# Patient Record
Sex: Female | Born: 1940 | Race: White | Hispanic: No | Smoking: Former smoker
Health system: Southern US, Community
[De-identification: ages and names within clinical notes are randomized; demographics above are authoritative.]

## PROBLEM LIST (undated history)

## (undated) DIAGNOSIS — E559 Vitamin D deficiency, unspecified: Secondary | ICD-10-CM

## (undated) DIAGNOSIS — F32A Depression, unspecified: Secondary | ICD-10-CM

## (undated) DIAGNOSIS — G4752 REM sleep behavior disorder: Secondary | ICD-10-CM

## (undated) DIAGNOSIS — C801 Malignant (primary) neoplasm, unspecified: Secondary | ICD-10-CM

## (undated) DIAGNOSIS — K8020 Calculus of gallbladder without cholecystitis without obstruction: Secondary | ICD-10-CM

## (undated) DIAGNOSIS — S92912A Unspecified fracture of left toe(s), initial encounter for closed fracture: Secondary | ICD-10-CM

## (undated) DIAGNOSIS — G43909 Migraine, unspecified, not intractable, without status migrainosus: Secondary | ICD-10-CM

## (undated) DIAGNOSIS — F513 Sleepwalking [somnambulism]: Secondary | ICD-10-CM

## (undated) DIAGNOSIS — F419 Anxiety disorder, unspecified: Secondary | ICD-10-CM

## (undated) DIAGNOSIS — T7840XA Allergy, unspecified, initial encounter: Secondary | ICD-10-CM

## (undated) DIAGNOSIS — S42009A Fracture of unspecified part of unspecified clavicle, initial encounter for closed fracture: Secondary | ICD-10-CM

## (undated) DIAGNOSIS — N828 Other female genital tract fistulae: Secondary | ICD-10-CM

## (undated) DIAGNOSIS — J449 Chronic obstructive pulmonary disease, unspecified: Secondary | ICD-10-CM

## (undated) DIAGNOSIS — F329 Major depressive disorder, single episode, unspecified: Secondary | ICD-10-CM

## (undated) DIAGNOSIS — R569 Unspecified convulsions: Secondary | ICD-10-CM

## (undated) DIAGNOSIS — K5732 Diverticulitis of large intestine without perforation or abscess without bleeding: Secondary | ICD-10-CM

## (undated) HISTORY — PX: NOSE SURGERY: SHX723

## (undated) HISTORY — DX: Major depressive disorder, single episode, unspecified: F32.9

## (undated) HISTORY — DX: Anxiety disorder, unspecified: F41.9

## (undated) HISTORY — DX: Allergy, unspecified, initial encounter: T78.40XA

## (undated) HISTORY — DX: Calculus of gallbladder without cholecystitis without obstruction: K80.20

## (undated) HISTORY — DX: Vitamin D deficiency, unspecified: E55.9

## (undated) HISTORY — DX: Chronic obstructive pulmonary disease, unspecified: J44.9

## (undated) HISTORY — PX: CATARACT EXTRACTION: SUR2

## (undated) HISTORY — DX: Migraine, unspecified, not intractable, without status migrainosus: G43.909

## (undated) HISTORY — DX: Unspecified convulsions: R56.9

## (undated) HISTORY — DX: Malignant (primary) neoplasm, unspecified: C80.1

## (undated) HISTORY — PX: WISDOM TOOTH EXTRACTION: SHX21

## (undated) HISTORY — DX: Fracture of unspecified part of unspecified clavicle, initial encounter for closed fracture: S42.009A

## (undated) HISTORY — PX: ANKLE SURGERY: SHX546

## (undated) HISTORY — DX: REM sleep behavior disorder: G47.52

## (undated) HISTORY — DX: Depression, unspecified: F32.A

## (undated) HISTORY — PX: TUBAL LIGATION: SHX77

## (undated) HISTORY — DX: Unspecified fracture of left toe(s), initial encounter for closed fracture: S92.912A

## (undated) HISTORY — DX: Other female genital tract fistulae: N82.8

## (undated) HISTORY — PX: SKIN CANCER EXCISION: SHX779

---
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
## 1980-11-20 HISTORY — PX: ABDOMINAL HYSTERECTOMY: SHX81

## 1998-12-29 ENCOUNTER — Ambulatory Visit (HOSPITAL_COMMUNITY): Admission: RE | Admit: 1998-12-29 | Discharge: 1998-12-29 | Payer: Self-pay | Admitting: *Deleted

## 1998-12-29 ENCOUNTER — Encounter: Payer: Self-pay | Admitting: *Deleted

## 2000-01-27 ENCOUNTER — Encounter: Payer: Self-pay | Admitting: *Deleted

## 2000-01-27 ENCOUNTER — Ambulatory Visit (HOSPITAL_COMMUNITY): Admission: RE | Admit: 2000-01-27 | Discharge: 2000-01-27 | Payer: Self-pay | Admitting: *Deleted

## 2000-07-25 ENCOUNTER — Ambulatory Visit (HOSPITAL_BASED_OUTPATIENT_CLINIC_OR_DEPARTMENT_OTHER): Admission: RE | Admit: 2000-07-25 | Discharge: 2000-07-25 | Payer: Self-pay | Admitting: Plastic Surgery

## 2000-07-25 ENCOUNTER — Encounter (INDEPENDENT_AMBULATORY_CARE_PROVIDER_SITE_OTHER): Payer: Self-pay | Admitting: *Deleted

## 2001-03-13 ENCOUNTER — Encounter: Payer: Self-pay | Admitting: *Deleted

## 2001-03-13 ENCOUNTER — Ambulatory Visit (HOSPITAL_COMMUNITY): Admission: RE | Admit: 2001-03-13 | Discharge: 2001-03-13 | Payer: Self-pay | Admitting: *Deleted

## 2001-08-01 ENCOUNTER — Encounter: Payer: Self-pay | Admitting: Family Medicine

## 2001-08-01 ENCOUNTER — Encounter: Admission: RE | Admit: 2001-08-01 | Discharge: 2001-08-01 | Payer: Self-pay | Admitting: Family Medicine

## 2001-11-01 ENCOUNTER — Emergency Department (HOSPITAL_COMMUNITY): Admission: EM | Admit: 2001-11-01 | Discharge: 2001-11-01 | Payer: Self-pay | Admitting: Emergency Medicine

## 2002-05-01 ENCOUNTER — Ambulatory Visit (HOSPITAL_COMMUNITY): Admission: RE | Admit: 2002-05-01 | Discharge: 2002-05-01 | Payer: Self-pay | Admitting: *Deleted

## 2002-05-01 ENCOUNTER — Encounter: Payer: Self-pay | Admitting: *Deleted

## 2002-07-03 ENCOUNTER — Ambulatory Visit (HOSPITAL_BASED_OUTPATIENT_CLINIC_OR_DEPARTMENT_OTHER): Admission: RE | Admit: 2002-07-03 | Discharge: 2002-07-04 | Payer: Self-pay | Admitting: Orthopedic Surgery

## 2003-08-13 ENCOUNTER — Encounter: Payer: Self-pay | Admitting: *Deleted

## 2003-08-13 ENCOUNTER — Ambulatory Visit (HOSPITAL_COMMUNITY): Admission: RE | Admit: 2003-08-13 | Discharge: 2003-08-13 | Payer: Self-pay | Admitting: *Deleted

## 2003-08-18 ENCOUNTER — Encounter: Admission: RE | Admit: 2003-08-18 | Discharge: 2003-08-18 | Payer: Self-pay | Admitting: *Deleted

## 2003-08-18 ENCOUNTER — Encounter: Payer: Self-pay | Admitting: General Surgery

## 2005-04-07 ENCOUNTER — Encounter: Admission: RE | Admit: 2005-04-07 | Discharge: 2005-04-07 | Payer: Self-pay | Admitting: *Deleted

## 2005-08-03 ENCOUNTER — Ambulatory Visit (HOSPITAL_COMMUNITY): Admission: RE | Admit: 2005-08-03 | Discharge: 2005-08-03 | Payer: Self-pay | Admitting: General Surgery

## 2006-04-15 ENCOUNTER — Ambulatory Visit (HOSPITAL_COMMUNITY): Admission: RE | Admit: 2006-04-15 | Discharge: 2006-04-15 | Payer: Self-pay | Admitting: Family Medicine

## 2006-05-18 ENCOUNTER — Encounter: Admission: RE | Admit: 2006-05-18 | Discharge: 2006-05-18 | Payer: Self-pay | Admitting: Gastroenterology

## 2006-08-15 ENCOUNTER — Ambulatory Visit (HOSPITAL_COMMUNITY): Admission: RE | Admit: 2006-08-15 | Discharge: 2006-08-15 | Payer: Self-pay | Admitting: General Surgery

## 2007-08-23 ENCOUNTER — Ambulatory Visit (HOSPITAL_COMMUNITY): Admission: RE | Admit: 2007-08-23 | Discharge: 2007-08-23 | Payer: Self-pay | Admitting: Family Medicine

## 2008-04-25 ENCOUNTER — Ambulatory Visit (HOSPITAL_COMMUNITY): Admission: RE | Admit: 2008-04-25 | Discharge: 2008-04-25 | Payer: Self-pay | Admitting: Family Medicine

## 2008-06-11 ENCOUNTER — Emergency Department (HOSPITAL_COMMUNITY): Admission: EM | Admit: 2008-06-11 | Discharge: 2008-06-11 | Payer: Self-pay | Admitting: Emergency Medicine

## 2008-08-25 ENCOUNTER — Ambulatory Visit (HOSPITAL_COMMUNITY): Admission: RE | Admit: 2008-08-25 | Discharge: 2008-08-25 | Payer: Self-pay | Admitting: General Surgery

## 2008-09-09 ENCOUNTER — Encounter: Admission: RE | Admit: 2008-09-09 | Discharge: 2008-09-09 | Payer: Self-pay | Admitting: General Surgery

## 2009-02-12 ENCOUNTER — Emergency Department (HOSPITAL_COMMUNITY): Admission: EM | Admit: 2009-02-12 | Discharge: 2009-02-13 | Payer: Self-pay | Admitting: Emergency Medicine

## 2009-02-24 IMAGING — CT CT ABD-PELV W/O CM
3 of 8 series · 12 of 42 positions shown, 18 images · IV contrast (CONTRAST)
Comparison: NONE

CLINICAL DATA: Mass in the pelvis of the right kidney on 
ultrasound  of the abdomen 09-10-2008. Hx. of hematuria and 
diverticulitis.   No prior CT exam. 

CT ABDOMEN AND PELVIS WITHOUT AND WITH INTRAVENOUS CONTRAST
TECHNIQUE: Multiple axial images were obtained from the diaphragm 
through the pelvis.

[Series 4: venous · axial · portal-venous · 0.68mm/px · z∈[+397,+672]mm · 5 of 83 slices shown, 10 images]
[im 14/83  soft-tissue]
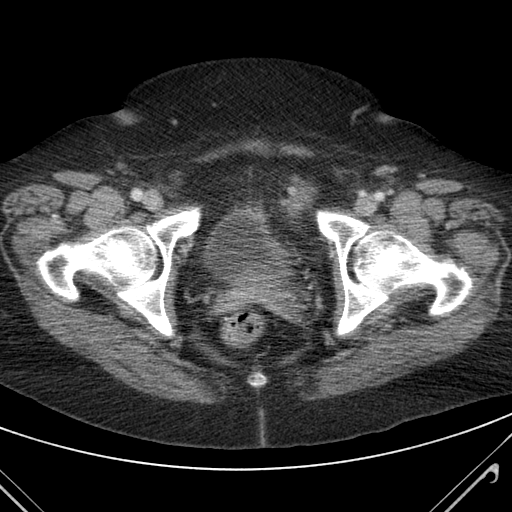
[im 14/83  bone]
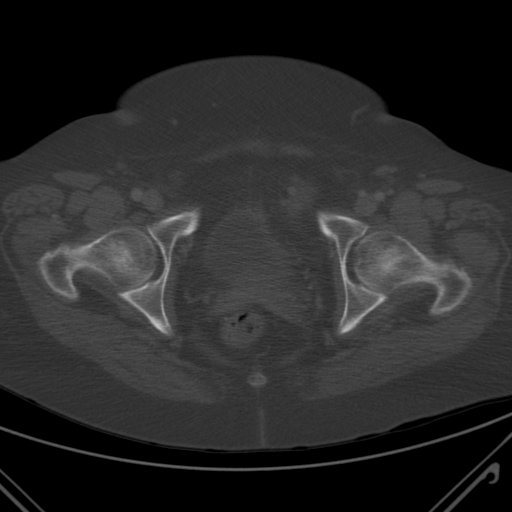
[im 28/83  soft-tissue]
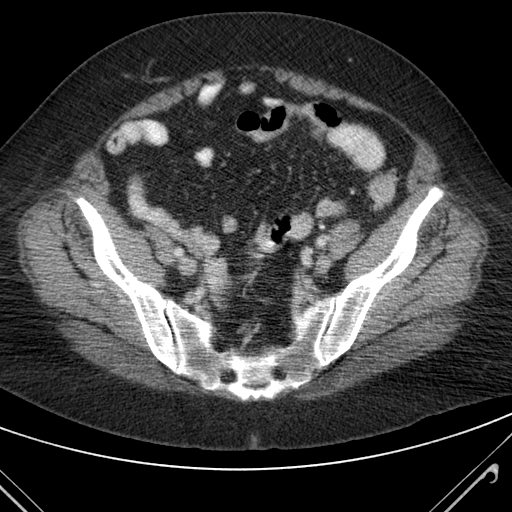
[im 28/83  lung]
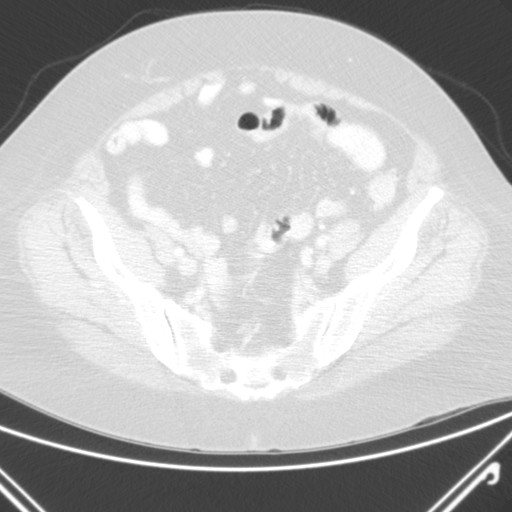
[im 42/83  soft-tissue]
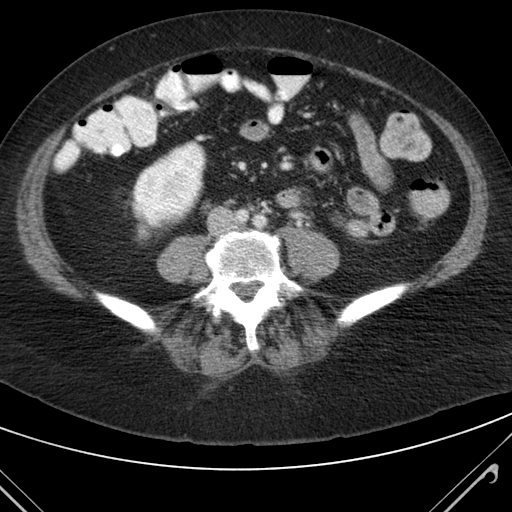
[im 42/83  lung]
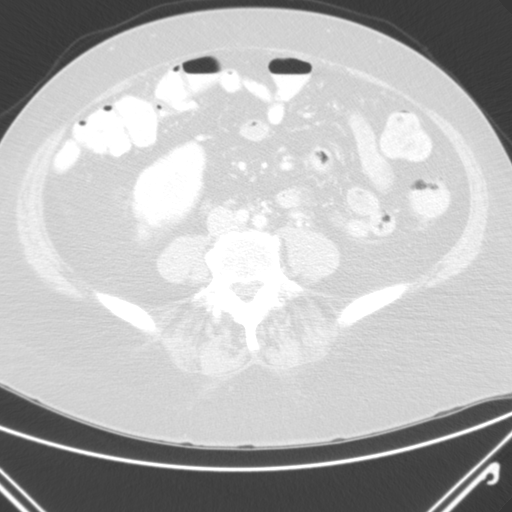
[im 55/83  soft-tissue]
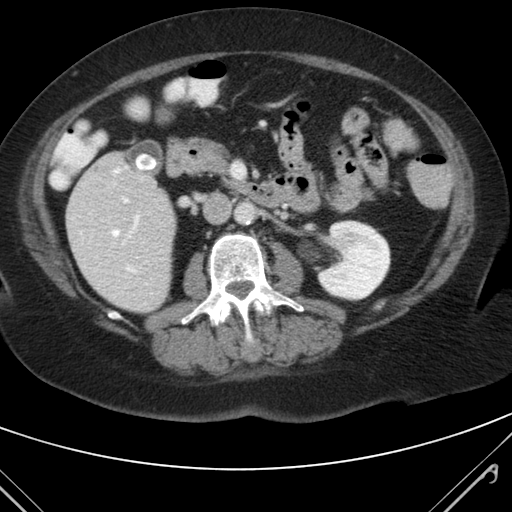
[im 55/83  lung]
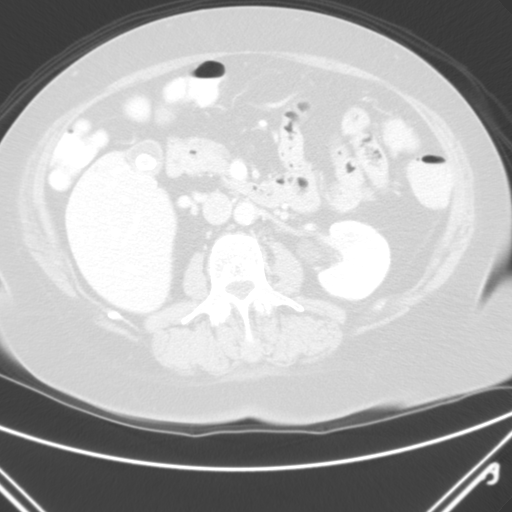
[im 69/83  soft-tissue]
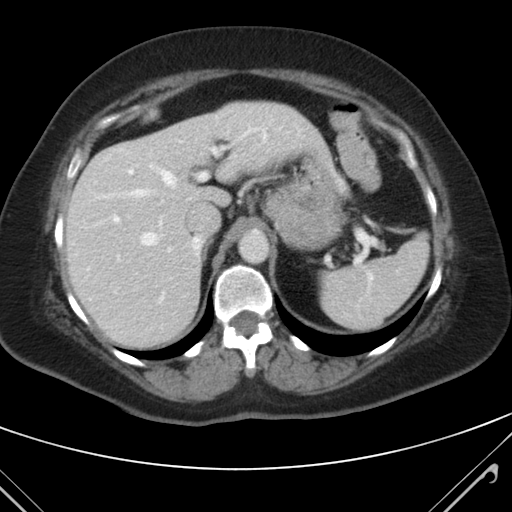
[im 69/83  lung]
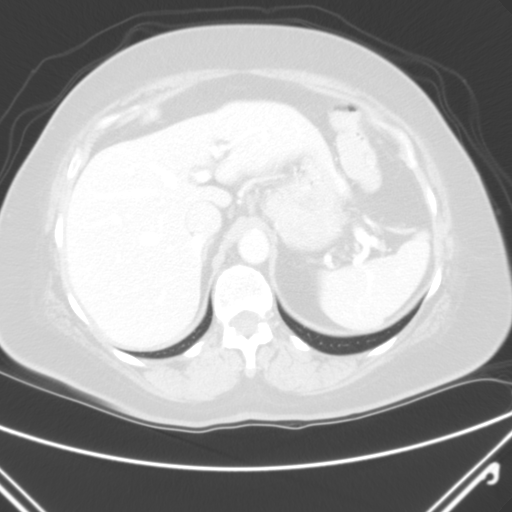

[Series 9: delays · axial · 0.68mm/px · z∈[+434,+639]mm · 4 of 69 slices shown]
[im 14/69  soft-tissue]
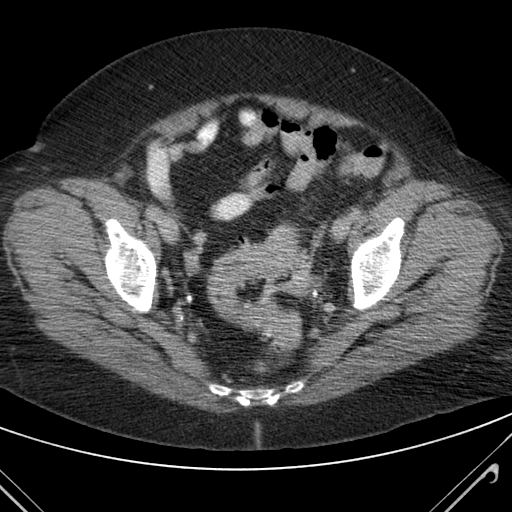
[im 28/69  soft-tissue]
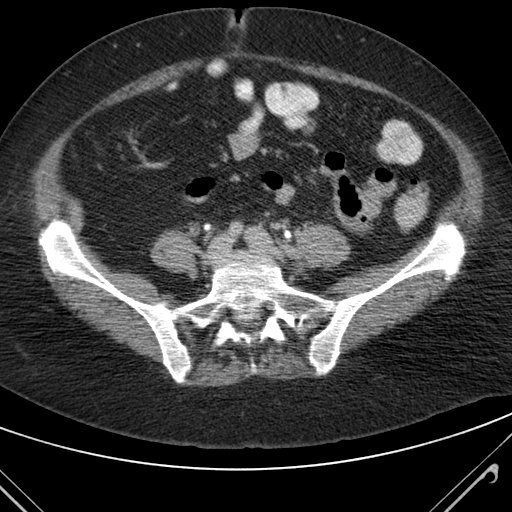
[im 41/69  soft-tissue]
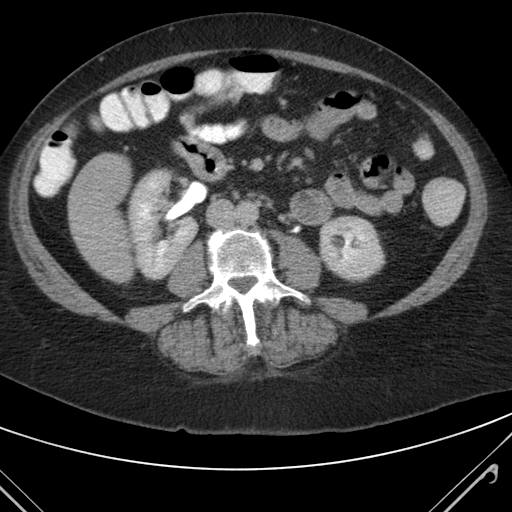
[im 55/69  soft-tissue]
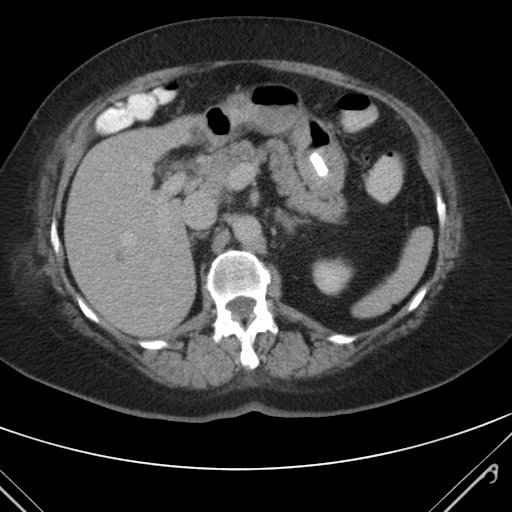

[coronals · coronal · 0.80mm/px · 3 of 80 slices shown, 4 images]
[im 27/80  soft-tissue]
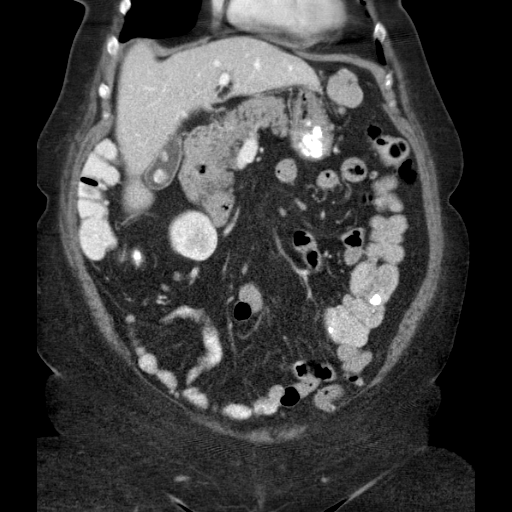
[im 36/80  soft-tissue]
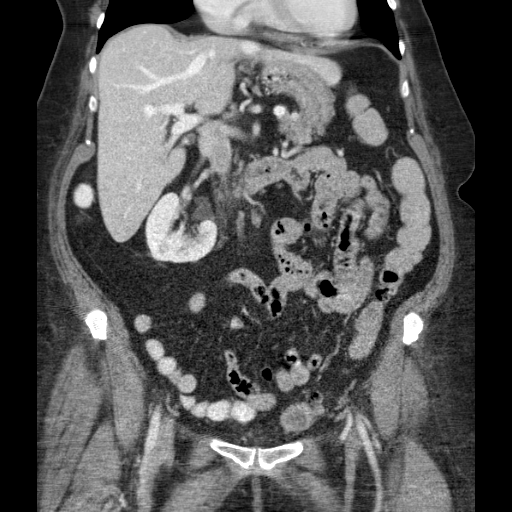
[im 36/80  bone]
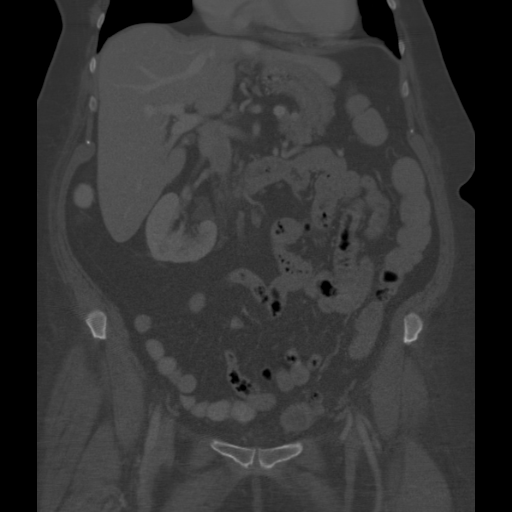
[im 44/80  soft-tissue]
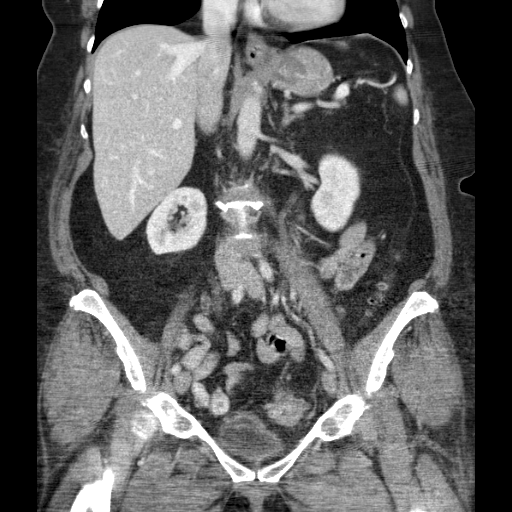

[12 of 42 positions shown; findings below may reference images not displayed]

FINDINGS: Lung bases are unremarkable.  No pleural effusions or 
pericardial effusion.  Small hiatal hernia.  Cholelithiasis 
without evidence of acute cholecystitis.  Several stable liver 
cysts; the largest measures about 1.5 cm.  Spleen, pancreas, and 
adrenal glands are normal.  Tiny nonobstructing right renal 
calculus.  No masses or stones in the pelvis of the right kidney.  
Small parapelvic left renal cyst.  The left kidney is otherwise 
normal.  Normal appendix.  Small periumbilical hernia containing 
fat.  Colonic diverticulosis without evidence of acute 
diverticulitis.  Hysterectomy.  No pelvic masses or free pelvic 
fluid.  No destructive bony lesions.
IMPRESSION: Tiny nonobstructing right renal calculus.  No masses 
or stones in the pelvis of the right kidney and no evidence of 
hydronephrosis.  Small left parapelvic  renal cyst.  No evidence 
of renal cell carcinoma. Diverticulosis without diverticulitis.  
Cholelithiasis. Stable liver cysts. Small periumbilical hernia 
containing fat. Done, Charoukhan., M.D. Alhaji Isa Godpower, M.D. 
Date: [DATE] DAUBACH MISSAL  JLM

## 2009-04-22 ENCOUNTER — Encounter: Admission: RE | Admit: 2009-04-22 | Discharge: 2009-04-22 | Payer: Self-pay | Admitting: Family Medicine

## 2009-07-18 IMAGING — CR DG HAND COMPLETE 3+V*L*
3 series · 3 of 3 positions shown · non-contrast
Comparison: None.

CLINICAL DATA: Dog bite to the left hand.

LEFT HAND - COMPLETE 3+ VIEW 02/12/2009:

[x hand ap left]
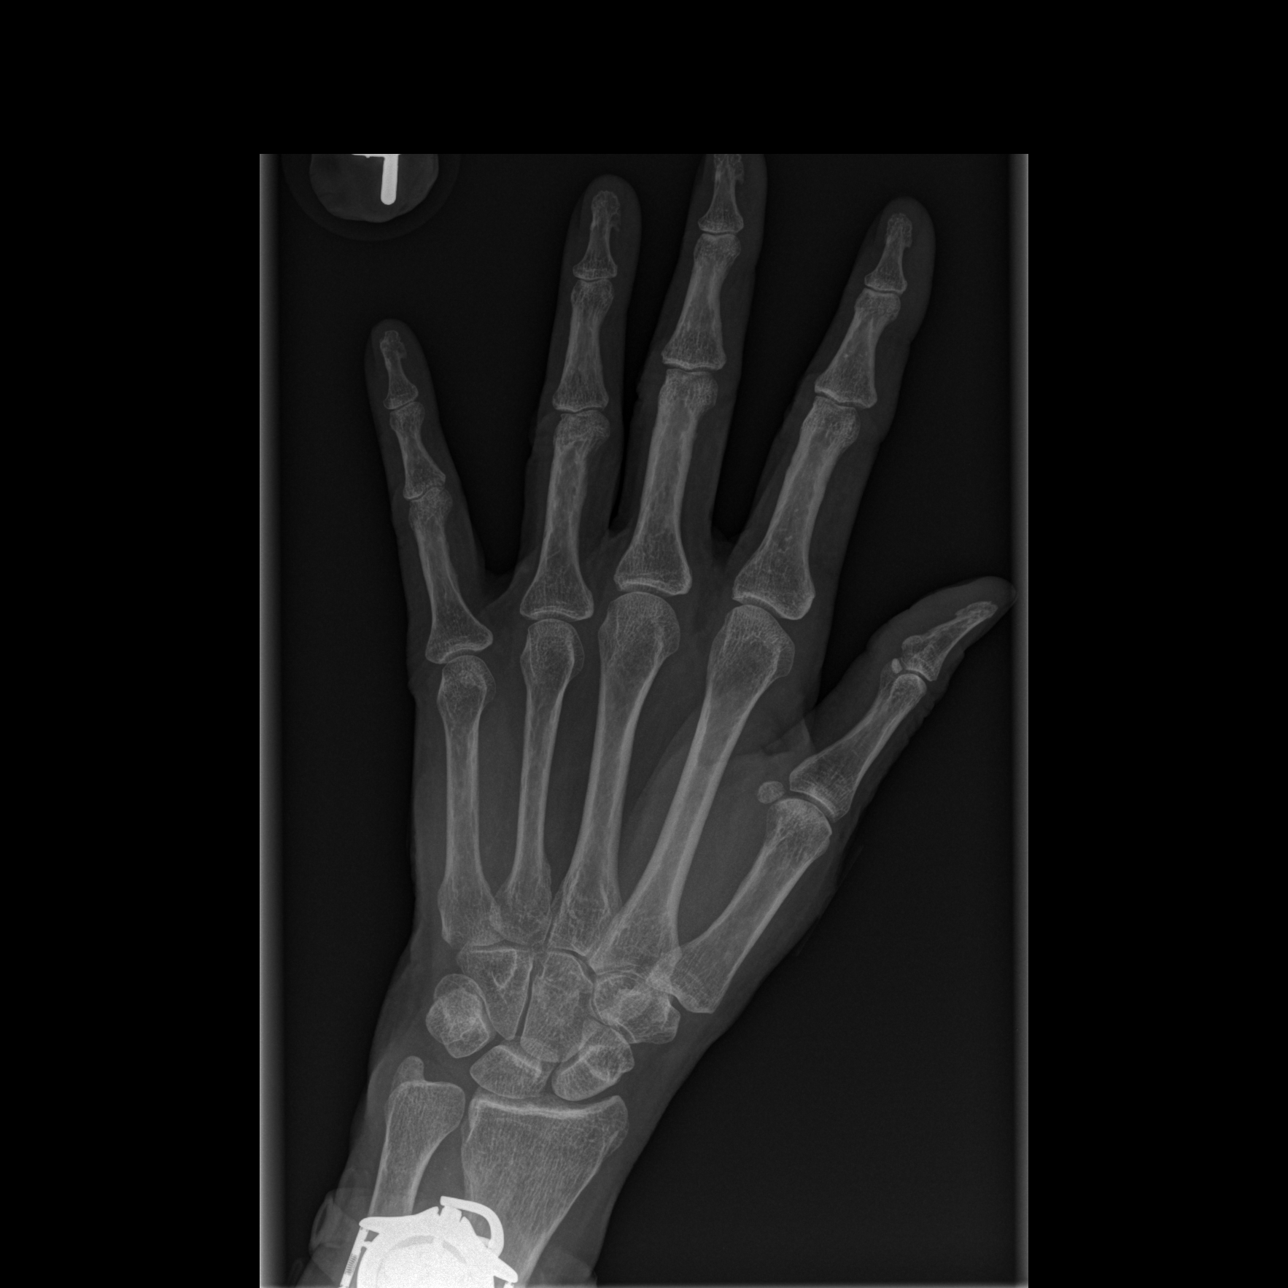

[x hand oblique left]
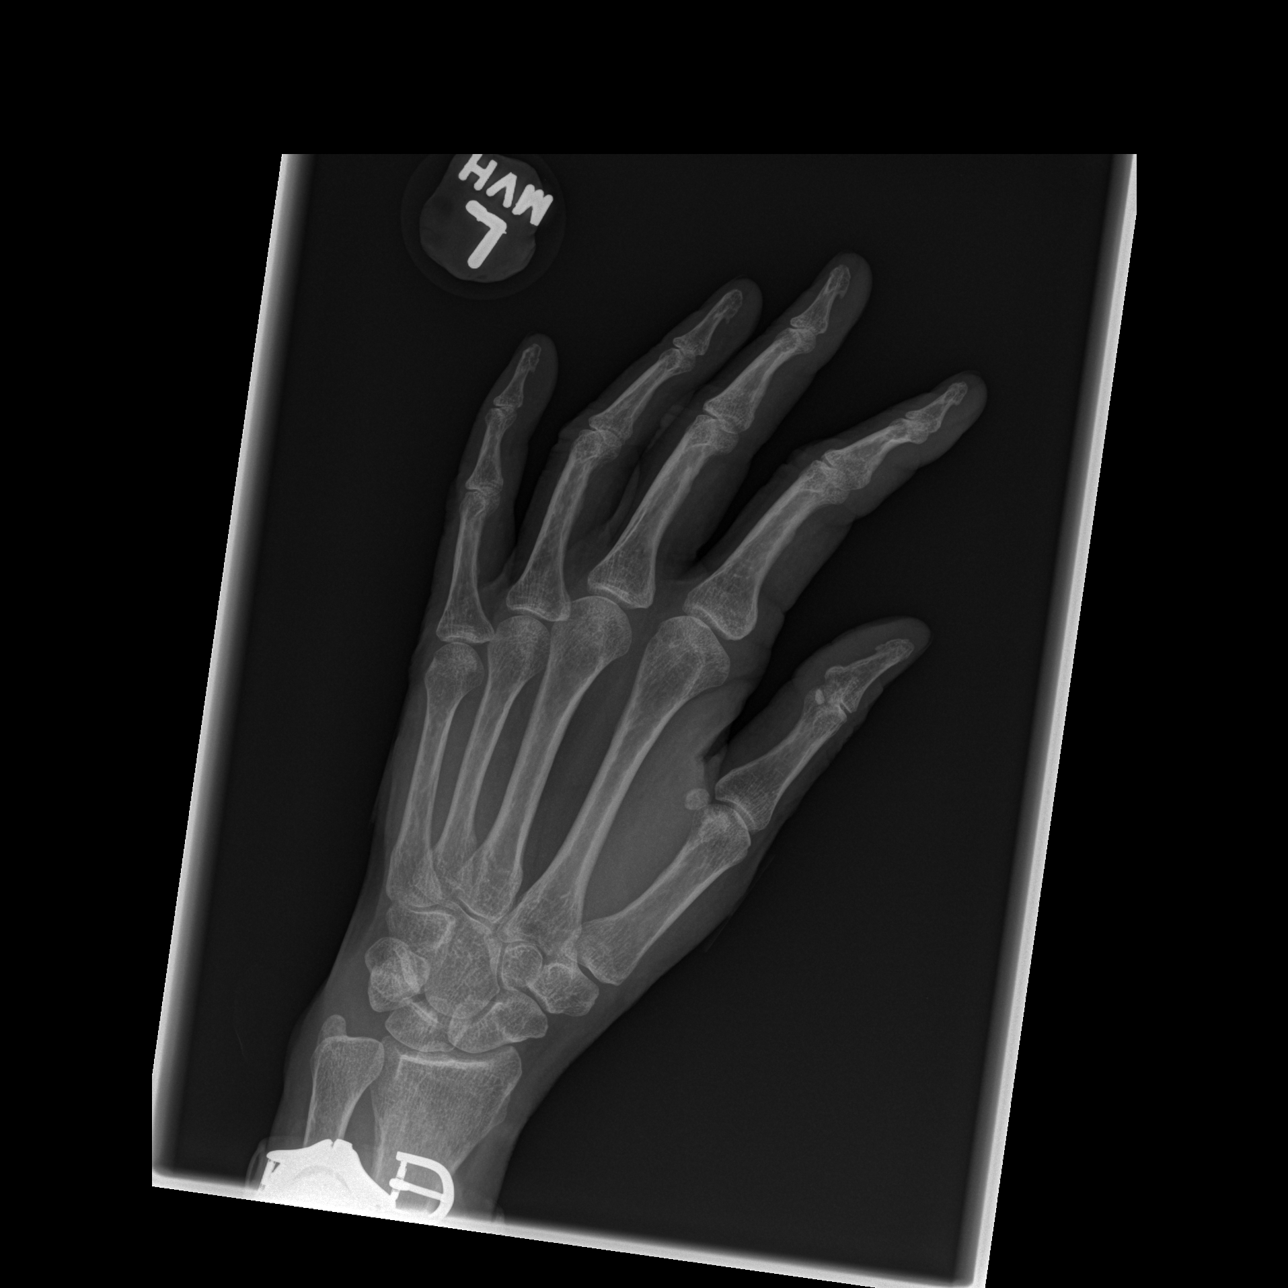

[x hand lat left]
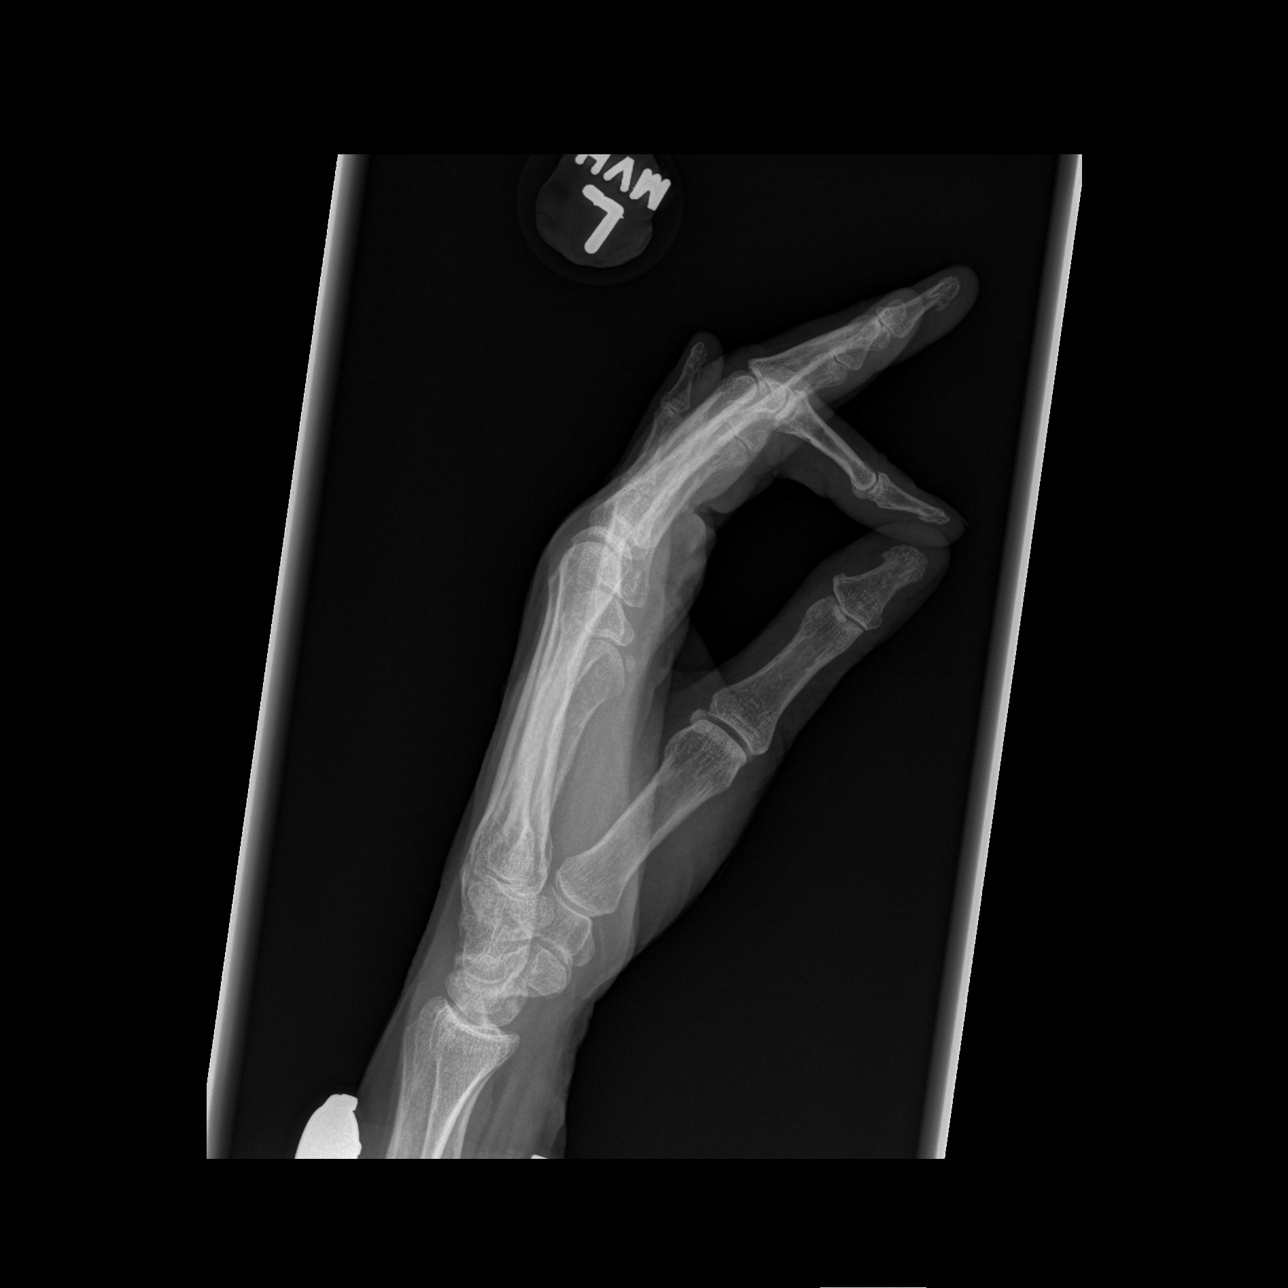

[3 of 3 positions shown; findings below may reference images not displayed]

FINDINGS: No evidence of acute fracture or dislocation.  Mild to
moderate generalized osteopenia.  Mild joint space narrowing
involving multiple IP joints.  No erosions.  Soft tissue injury
laterally without underlying skeletal abnormalities.
IMPRESSION: No acute skeletal abnormalities.  Osteopenia.  Mild osteoarthritis.

## 2010-03-23 ENCOUNTER — Ambulatory Visit (HOSPITAL_COMMUNITY): Admission: RE | Admit: 2010-03-23 | Discharge: 2010-03-23 | Payer: Self-pay | Admitting: Obstetrics & Gynecology

## 2011-01-11 ENCOUNTER — Other Ambulatory Visit: Payer: Self-pay | Admitting: Emergency Medicine

## 2011-01-11 ENCOUNTER — Ambulatory Visit (HOSPITAL_COMMUNITY)
Admission: RE | Admit: 2011-01-11 | Discharge: 2011-01-11 | Disposition: A | Discharge status: Home / Self Care | Payer: Medicare Other | Source: Ambulatory Visit | Attending: Emergency Medicine | Admitting: Emergency Medicine

## 2011-01-11 ENCOUNTER — Inpatient Hospital Stay (HOSPITAL_COMMUNITY)
Admission: EM | Admit: 2011-01-11 | Discharge: 2011-01-18 | DRG: 392 | Disposition: A | Discharge status: Home / Self Care | Payer: Medicare Other | Attending: Surgery | Admitting: Surgery

## 2011-01-11 DIAGNOSIS — K7689 Other specified diseases of liver: Secondary | ICD-10-CM | POA: Insufficient documentation

## 2011-01-11 DIAGNOSIS — K8020 Calculus of gallbladder without cholecystitis without obstruction: Secondary | ICD-10-CM | POA: Diagnosis present

## 2011-01-11 DIAGNOSIS — I700 Atherosclerosis of aorta: Secondary | ICD-10-CM | POA: Insufficient documentation

## 2011-01-11 DIAGNOSIS — K5732 Diverticulitis of large intestine without perforation or abscess without bleeding: Secondary | ICD-10-CM | POA: Insufficient documentation

## 2011-01-11 DIAGNOSIS — R1032 Left lower quadrant pain: Secondary | ICD-10-CM | POA: Insufficient documentation

## 2011-01-11 DIAGNOSIS — E876 Hypokalemia: Secondary | ICD-10-CM | POA: Diagnosis present

## 2011-01-11 DIAGNOSIS — K5792 Diverticulitis of intestine, part unspecified, without perforation or abscess without bleeding: Secondary | ICD-10-CM

## 2011-01-11 DIAGNOSIS — N321 Vesicointestinal fistula: Secondary | ICD-10-CM | POA: Diagnosis present

## 2011-01-11 DIAGNOSIS — K630 Abscess of intestine: Secondary | ICD-10-CM | POA: Insufficient documentation

## 2011-01-11 HISTORY — DX: Diverticulitis of large intestine without perforation or abscess without bleeding: K57.32

## 2011-01-11 LAB — DIFFERENTIAL
Basophils Relative: 0 % (ref 0–1)
Eosinophils Absolute: 0.1 10*3/uL (ref 0.0–0.7)
Monocytes Absolute: 1.1 10*3/uL — ABNORMAL HIGH (ref 0.1–1.0)
Monocytes Relative: 7 % (ref 3–12)
Neutro Abs: 14 10*3/uL — ABNORMAL HIGH (ref 1.7–7.7)

## 2011-01-11 LAB — CBC
HCT: 37.1 % (ref 36.0–46.0)
MCHC: 33.4 g/dL (ref 30.0–36.0)
MCV: 90 fL (ref 78.0–100.0)
Platelets: 203 10*3/uL (ref 150–400)
RDW: 12.7 % (ref 11.5–15.5)
WBC: 16.7 10*3/uL — ABNORMAL HIGH (ref 4.0–10.5)

## 2011-01-11 LAB — URINALYSIS, ROUTINE W REFLEX MICROSCOPIC
Ketones, ur: 15 mg/dL — AB
Protein, ur: 30 mg/dL — AB
Specific Gravity, Urine: >1.046 — ABNORMAL HIGH (ref 1.005–1.030)
Urine Glucose, Fasting: NEGATIVE mg/dL
pH: 5.5 (ref 5.0–8.0)

## 2011-01-11 LAB — COMPREHENSIVE METABOLIC PANEL
ALT: 13 U/L (ref 0–35)
AST: 20 U/L (ref 0–37)
CO2: 21 mEq/L (ref 19–32)
GFR calc Af Amer: >60 mL/min (ref >60)
GFR calc non Af Amer: 59 mL/min — ABNORMAL LOW (ref >60)
Glucose, Bld: 105 mg/dL — ABNORMAL HIGH (ref 70–99)
Sodium: 132 mEq/L — ABNORMAL LOW (ref 135–145)
Total Bilirubin: 1.1 mg/dL (ref 0.3–1.2)

## 2011-01-11 LAB — BUN: BUN: 17 mg/dL (ref 6–23)

## 2011-01-11 LAB — CREATININE, SERUM
Creatinine, Ser: 1 mg/dL (ref 0.4–1.2)
GFR calc Af Amer: >60 mL/min (ref >60)
GFR calc non Af Amer: 55 mL/min — ABNORMAL LOW (ref >60)

## 2011-01-11 LAB — URINE MICROSCOPIC-ADD ON

## 2011-01-11 MED ORDER — IOHEXOL 300 MG/ML  SOLN
100.0000 mL | Freq: Once | INTRAMUSCULAR | Status: AC | PRN
  Administered 2011-01-11: 100 mL via INTRAVENOUS

## 2011-01-13 ENCOUNTER — Inpatient Hospital Stay (HOSPITAL_COMMUNITY): Payer: Medicare Other

## 2011-01-13 LAB — BASIC METABOLIC PANEL
Chloride: 111 mEq/L (ref 96–112)
GFR calc Af Amer: >60 mL/min (ref >60)
GFR calc non Af Amer: >60 mL/min (ref >60)
Potassium: 3.6 mEq/L (ref 3.5–5.1)
Sodium: 141 mEq/L (ref 135–145)

## 2011-01-13 LAB — CBC
Hemoglobin: 10.8 g/dL — ABNORMAL LOW (ref 12.0–15.0)
RBC: 3.74 MIL/uL — ABNORMAL LOW (ref 3.87–5.11)
WBC: 6.1 10*3/uL (ref 4.0–10.5)

## 2011-01-13 MED ORDER — GADOBENATE DIMEGLUMINE 529 MG/ML IV SOLN
19.0000 mL | Freq: Once | INTRAVENOUS | Status: AC | PRN
  Administered 2011-01-13: 19 mL via INTRAVENOUS

## 2011-01-16 ENCOUNTER — Encounter (HOSPITAL_COMMUNITY): Payer: Self-pay | Admitting: Radiology

## 2011-01-16 ENCOUNTER — Inpatient Hospital Stay (HOSPITAL_COMMUNITY): Payer: Medicare Other

## 2011-01-16 MED ORDER — IOHEXOL 300 MG/ML  SOLN
100.0000 mL | Freq: Once | INTRAMUSCULAR | Status: AC | PRN
  Administered 2011-01-16: 100 mL via INTRAVENOUS

## 2011-01-18 LAB — COMPREHENSIVE METABOLIC PANEL
ALT: 33 U/L (ref 0–35)
Albumin: 2.9 g/dL — ABNORMAL LOW (ref 3.5–5.2)
Alkaline Phosphatase: 45 U/L (ref 39–117)
BUN: 2 mg/dL — ABNORMAL LOW (ref 6–23)
Chloride: 111 mEq/L (ref 96–112)
Glucose, Bld: 96 mg/dL (ref 70–99)
Potassium: 3 mEq/L — ABNORMAL LOW (ref 3.5–5.1)
Sodium: 141 mEq/L (ref 135–145)
Total Bilirubin: 0.4 mg/dL (ref 0.3–1.2)
Total Protein: 5.5 g/dL — ABNORMAL LOW (ref 6.0–8.3)

## 2011-01-18 LAB — CBC
HCT: 35.9 % — ABNORMAL LOW (ref 36.0–46.0)
MCV: 89.5 fL (ref 78.0–100.0)
RBC: 4.01 MIL/uL (ref 3.87–5.11)
RDW: 12.8 % (ref 11.5–15.5)
WBC: 6.6 10*3/uL (ref 4.0–10.5)

## 2011-01-18 NOTE — H&P (Signed)
NAMESAESHA, LLERENAS                ACCOUNT NO.:  [id number]  MEDICAL RECORD NO.:  [id number]           PATIENT TYPE:  O  LOCATION:  XRAY                         FACILITY:  [facility]  PHYSICIAN:  Juanetta Gosling, MDDATE OF BIRTH:  [birth year]  DATE OF ADMISSION:  01/11/2011 DATE OF DISCHARGE:  01/11/2011                             HISTORY & PHYSICAL   CHIEF COMPLAINT:  Abdominal pain.  HISTORY OF PRESENT ILLNESS:  This is a 70 year old female who by her report has had multiple, what sounds like, episodes of outpatient diverticulitis for which she has been treated with oral antibiotics since about around 2006.  She does not know how many of these she has had.  She does state she has a history of a colonoscopy by Dr. Dorena Cookey in around the year 2000 as well.  She presented on February 20 to [facility] after she began developing suprapubic pain that was worsening and some frequency when she was diagnosed with a UTI and was treated with Cipro at that time.  She returned yesterday with worsening, more frequent lower abdominal pain and she was noted to have a white blood cell count of 19 by Dr. Celedonio Savage and was sent for a CT scan at that point.  She is having small bowel movements and is passing flatus.  No nausea or vomiting.  No fevers.  Her pain has been colicky.  She declines of no real dysuria or urinary frequency.  Pain is not really relieved by anything.  It is aggravated just by movement and palpation.  PAST MEDICAL HISTORY:  Allergies and asthma.  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  PAST SURGICAL HISTORY:  Total vaginal hysterectomy and a left ankle ORIF.  MEDICATIONS: 1. Ciprofloxacin. 2. Benzonatate. 3. Allegra. 4. Aspirin 81 mg. 5. Wellbutrin.  SOCIAL HISTORY:  She works as a Conservation officer, nature at [employer].  She does not smoke or drink alcohol.  ALLERGIES:  ERYTHROMYCIN, SULFA, AMOXICILLIN,  HYDROCODONE.  PHYSICAL EXAMINATION:  VITAL SIGNS:  Temperature 98.6, pulse 68, blood pressure 118/73, respiratory rate 28 and saturating 98% on room air. GENERAL:  She is a well-appearing female in no distress. HEENT:  She has no scleral icterus.  She has a mildly enlarged thyroid gland. NECK:  Shows no cervical adenopathy and is supple. HEART:  Regular rate and rhythm. LUNGS:  Clear bilaterally. ABDOMEN:  Soft, obese, is mildly tender in her suprapubic region, but no peritoneal signs and bowel sounds are present.  LABORATORY DATA:  Evaluation shows a sodium of 132, potassium 3.5, BUN 15, creatinine 0.95, glucose of 105.  Her liver function tests are all within normal limits.  White blood cell count of 16.7, hematocrit 37, platelets 203.  Urinalysis shows large blood and large leukocytes.  RADIOLOGIC:  CT scan shows a complex partially cystic-appearing structure lateral to her falciform ligament that was present on a previous CT scan and has gotten larger in size at that point.  She also has a CT scan from 2009, which did show sigmoid diverticulitis and extensive  phlegmon formation.  Her CT scan also from this time shows diverticulitis with a pelvic likely inner loop abscess.  ASSESSMENT: 1. Sigmoid diverticulitis with abscess. 2. Liver lesion.  PLAN:  We discussed the treatment for diverticular disease, in her case it being antibiotics and n.p.o.  We can ask IR if they are going to be able to drain this area.  I am not sure that they are going to be able to due to its positioning though.  We can try conservative treatment and see if she will get better with this.  If not, we discussed the role of surgery in the emergency setting, but we will give her every chance to try to get through this with conservative treatment.  We also discussed obtaining a liver MRI at a later date to reassess that liver lesion.     Juanetta Gosling, MD     MCW/MEDQ  D:  01/12/2011  T:   01/12/2011  Job:  [id number]  cc:   Brett Canales A. Cleta Alberts, M.D. Fax: [phone]  Marcos Eke. Hal Hope, M.D. Fax: [phone]  Everardo All. Madilyn Fireman, M.D. Fax: [phone]  Electronically Signed by Emelia Loron MD on 01/18/2011 04:07:09 PM

## 2011-02-15 NOTE — Discharge Summary (Signed)
Rebecca Cross, Rebecca Cross                ACCOUNT NO.:  [id number]  MEDICAL RECORD NO.:  [id number]           PATIENT TYPE:  I  LOCATION:  1536                         FACILITY:  [facility]  PHYSICIAN:  Juanetta Gosling, MDDATE OF BIRTH:  [birth year]  DATE OF ADMISSION:  01/11/2011 DATE OF DISCHARGE:  01/18/2011                              DISCHARGE SUMMARY   ADMISSION DIAGNOSES: 1. Sigmoid diverticulitis. 2. Liver lesion.  DISCHARGE DIAGNOSES: 1. Sigmoid diverticulitis. 2. 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 3. Cholelithiasis. 4. Questionable history of epilepsy. 5. MRI of the liver shows no lesion, January 18, 2011.  BRIEF HISTORY:  The patient is a 70 year old white female who presented with what sounds like an episode of diverticula.  She has had multiple rounds of antibiotics since about 2006, she does not know how many.  She does state that she had a colonoscopy by Dr. Dorena Cookey around 2000. She presented on February 20th to [facility] after developing suprapubic pain which was worsening and some frequency when she was diagnosed with urinary tract infection and placed on Cipro.  She returned yesterday with worsening lower abdominal pain and a white blood cell count of 19,000.  She was sent for CT at that point.  She reports currently having small bowel movement, passing flatus.  There is no nausea or vomiting.  No fever.  Her pain has been colicky.  She declines any real dysuria or urinary frequency.  Pain is not really relieved by anything, it is aggravated by movement and palpation.  Workup in the ER showed electrolytes with a sodium of 132, a potassium of 3.5, BUN was 15, creatinine was 0.95, glucose was 105.  LFTs were normal.  White blood cell count was 16,700, hematocrit was 37, platelets 203,000.  UA showed a large amount of blood and white cells.  CT scan at that time showed a complex partial cystic-appearing structure lateral to the falciform ligament that was  present on a previous CT, it has gotten larger at that point CT scan from 2009.  The CT scan at this time shows diverticulitis with what looks like a pelvic interloop abscess.  She was admitted by Dr. Dwain Sarna with plans to place her on antibiotics and have her considered by IR for drain placement.  She did extremely well over the next 24 hours and it was opted to start her on clear liquids and discontinue the antibiotics.  She made really quite good progress and her diet was advanced.  She had a lesion on her liver that was suspicious and an MRI of the abdomen with and without contrast was obtained.  This showed minimally septated cystic lesions in the left hepatic lobe demonstrating no aggressive characteristics and appears only minimally enlarged since her CT in 2009, there is cholelithiasis without evidence of biliary dilatation or choledocholithiasis.  There were no acute findings suggestive of PID.  This was followed by a repeat CT on January 18, 2011, which showed evolution of a sigmoid diverticulitis with near-complete resolution of the prior diverticular abscess.  Gas and contrast were seen within the vagina likely secondary to a colovaginal fistula with  vesicovaginal fistula being less likely. There was cholelithiasis without inflammatory changes.  There was a 2.3- cm cystic lesion in the medial ligament of the left hepatic lobe which was slowly growing from 2006.  After these studies were completed, it was Dr. Jamse Mead opinion that the patient had progressed well and can be discharged home on antibiotics.  She was scheduled for a followup with Dr. Johna Sheriff on January 12, 2011.  CONDITION ON DISCHARGE:  Improved.  DISCHARGE MEDICATIONS:  Included her preadmission medicines, 1. Cipro 500 mg b.i.d. 2. Flagyl 500 mg q.8 h.  CONDITION ON DISCHARGE:  Improved.     Eber Hong, P.A.   ______________________________ Juanetta Gosling, MD    WDJ/MEDQ  D:   02/09/2011  T:  02/10/2011  Job:  [id number]  cc:   Everardo All. Madilyn Fireman, M.D. Fax: [phone]  Marcos Eke. Hal Hope, M.D. Fax: [phone]  Stan Head. Cleta Alberts, M.D. Fax: [phone]  Electronically Signed by Sherrie George P.A. on 02/14/2011 03:40:29 PM Electronically Signed by Emelia Loron MD on 02/15/2011 12:26:21 PM

## 2011-04-07 NOTE — Op Note (Signed)
NAME:  Rebecca Cross, Rebecca Cross                          ACCOUNT NO.:  [id number]   MEDICAL RECORD NO.:  [id number]                   PATIENT TYPE:  UT   LOCATION:                                       FACILITY:  [facility]   PHYSICIAN:  Loreta Ave, M.D.              DATE OF BIRTH:  [birth year]   DATE OF PROCEDURE:  07/03/2002  DATE OF DISCHARGE:                                 OPERATIVE REPORT   PREOPERATIVE DIAGNOSES:  Displaced lateral malleolus fracture, left; with  torn deltoid ligament.   POSTOPERATIVE DIAGNOSES:  Displaced lateral malleolus fracture, left; with  torn deltoid ligament.   OPERATIVE PROCEDURE:  Open reduction internal fixation, lateral malleolus  fracture; with a DuPuy 1/3 tubular 6.0 plate and screws.  Closed treatment  of deltoid tear.   SURGEON:  Loreta Ave, M.D.   ASSISTANT:  Arlys John D. Petrarca, P.A.-C.   ANESTHESIA:  General.   ESTIMATED BLOOD LOSS:  Minimal.   TOURNIQUET TIME:  40 min.   SPECIMENS:  None.   CULTURES:  None.   COMPLICATIONS:  None.   DRESSINGS:  Soft compressive with CAM walker.   DESCRIPTION OF PROCEDURE:  The patient was brought to the operating room and  placed on the operating room table in the supine position.  After adequate  anesthesia had been obtained, a tourniquet applied to the upper aspect of  the left leg.  Splint removed.  Prepped and draped in the usual sterile  fashion.  Exsanguinated with elevation Esmarch, tourniquet inflated to 350  mmHg.  Fluoroscopic views of the ankle revealed completely torn deltoid  ligament with widening of the mortise and displacement of the lateral  malleolus fracture; but syndesmosis are intact.  A longitudinal incision  over the fibula, posterolaterally.  Skin and subcutaneous tissue divided.  Subperiosteal exposure of the fracture.  Completely cleared of debris and  hematoma, and early callous.  Pressure reduced anatomically.  A 1/3 tubular  six-hole plate was prebent and placed on  the posterolateral shaft of the  fibula.  It was then solidly affixed with three cortical screws proximally  and three cancellous screws distally.  Care taken not to interrupt the  syndesmosis of her ankle with the screws.  Solid stable fixation.  Medial  side did not open with stress  after fixation laterally.  The wound was irrigated.  Closed with Vicryl  and  then staples.  Sterile compressive dressing applied.  CAM walker applied.  Tourniquet deflated and removed.  Anesthesia reversed.  Brought to the  recovery room.  Tolerated the surgery with no complications.                                                Loreta Ave, M.D.  DFM/MEDQ  D:  07/03/2002  T:  07/05/2002  Job:  [id number]

## 2011-06-01 ENCOUNTER — Other Ambulatory Visit (INDEPENDENT_AMBULATORY_CARE_PROVIDER_SITE_OTHER): Payer: Self-pay | Admitting: General Surgery

## 2011-06-01 DIAGNOSIS — Z1231 Encounter for screening mammogram for malignant neoplasm of breast: Secondary | ICD-10-CM

## 2011-06-05 ENCOUNTER — Ambulatory Visit (INDEPENDENT_AMBULATORY_CARE_PROVIDER_SITE_OTHER): Payer: Self-pay | Admitting: General Surgery

## 2011-06-08 ENCOUNTER — Ambulatory Visit (HOSPITAL_COMMUNITY)
Admission: RE | Admit: 2011-06-08 | Discharge: 2011-06-08 | Disposition: A | Discharge status: Home / Self Care | Payer: Medicare Other | Source: Ambulatory Visit | Attending: General Surgery | Admitting: General Surgery

## 2011-06-08 DIAGNOSIS — Z1231 Encounter for screening mammogram for malignant neoplasm of breast: Secondary | ICD-10-CM | POA: Insufficient documentation

## 2011-06-19 ENCOUNTER — Ambulatory Visit (INDEPENDENT_AMBULATORY_CARE_PROVIDER_SITE_OTHER): Payer: Medicare Other | Admitting: General Surgery

## 2011-06-19 ENCOUNTER — Encounter (INDEPENDENT_AMBULATORY_CARE_PROVIDER_SITE_OTHER): Payer: Self-pay | Admitting: General Surgery

## 2011-06-19 DIAGNOSIS — N6019 Diffuse cystic mastopathy of unspecified breast: Secondary | ICD-10-CM

## 2011-06-19 NOTE — Progress Notes (Signed)
Subjective:     Patient ID: [name], female   DOB: [birth year], 70 y.o.   MRN: [id number]  HPI The patient is a 70 year old white female who we last saw in October of 2009 for fibrocystic disease of her breast. She continues to have intermittent pain in both breasts, more so on the left. She has had no discharge from her nipple on either side. She recently underwent routine screening mammogram that showed no evidence of malignancy. Of note earlier this year she also experienced an episode of diverticulitis and she is now being evaluated for a colovaginal fistula.  Review of Systems     Objective:   Physical Exam On exam Lungs: Clear bilaterally with no use of accessory respiratory muscles Heart: Regular rate and rhythm with an impulse in the left chest Abdomen: Soft and nontender Breasts: No palpable mass in either breast. No axillary supraclavicular or cervical lymphadenopathy on either side.    Assessment:     Fibrocystic disease of the breast.    Plan:     At this point she will follow with Dr. Johna Sheriff to evaluate her colovesical fistula. She can return to see [place] on a yearly basis for her breast exam and she should continue to follow with her medical doctor.

## 2011-06-29 ENCOUNTER — Ambulatory Visit (INDEPENDENT_AMBULATORY_CARE_PROVIDER_SITE_OTHER): Payer: Medicare Other | Admitting: General Surgery

## 2011-06-29 ENCOUNTER — Encounter (INDEPENDENT_AMBULATORY_CARE_PROVIDER_SITE_OTHER): Payer: Self-pay | Admitting: General Surgery

## 2011-06-29 VITALS — Wt 184.5 lb

## 2011-06-29 DIAGNOSIS — K5732 Diverticulitis of large intestine without perforation or abscess without bleeding: Secondary | ICD-10-CM | POA: Insufficient documentation

## 2011-06-29 DIAGNOSIS — N824 Other female intestinal-genital tract fistulae: Secondary | ICD-10-CM | POA: Insufficient documentation

## 2011-06-29 NOTE — Progress Notes (Signed)
Patient returns for followup for diverticulitis after she was hospitalized in February with a abscess treated nonoperatively. She had a CT scan showing a colovaginal fistula with contrast in the vagina. We have discussed surgery in March but due to financial concerns and the fact she was feeling much better she has deferred surgery. She states that over the last several months she has had no further attacks of pain. She has a stable small amount of vaginal drainage which is not particularly a problem for her. No fever or chills.  On examination she generally appears well. Her abdomen is soft and nontender. No palpable masses.  Assessment and plan: History of recurrent sigmoid diverticulitis. Colovaginal fistula. She has been doing well in recent months and is minimally symptomatic. Due to concerns above she would rather defer surgery for now. I asked her to call if she had any further pain and will plan followup in 6 months.

## 2011-06-29 NOTE — Patient Instructions (Signed)
Call for pain, fever, or other concerns.

## 2011-08-17 LAB — CREATININE, SERUM: GFR calc Af Amer: >60

## 2011-08-18 LAB — CBC
HCT: 35.8 — ABNORMAL LOW
Hemoglobin: 11.9 — ABNORMAL LOW
MCHC: 33.2
MCV: 87.5
RBC: 4.09
RDW: 15.5

## 2011-08-18 LAB — BASIC METABOLIC PANEL
CO2: 26
Chloride: 107
GFR calc Af Amer: >60
Glucose, Bld: 92
Sodium: 141

## 2011-08-18 LAB — DIFFERENTIAL
Basophils Absolute: 0
Basophils Relative: 0
Eosinophils Absolute: 0.3
Eosinophils Relative: 4
Monocytes Absolute: 0.5
Monocytes Relative: 6

## 2011-08-18 LAB — POCT CARDIAC MARKERS
CKMB, poc: <1 — ABNORMAL LOW
Myoglobin, poc: 52.9
Myoglobin, poc: 74.3
Operator id: 231701
Troponin i, poc: <0.05

## 2011-11-02 ENCOUNTER — Ambulatory Visit (INDEPENDENT_AMBULATORY_CARE_PROVIDER_SITE_OTHER): Payer: Medicare Other

## 2011-11-02 DIAGNOSIS — Z23 Encounter for immunization: Secondary | ICD-10-CM

## 2012-07-03 ENCOUNTER — Ambulatory Visit (INDEPENDENT_AMBULATORY_CARE_PROVIDER_SITE_OTHER): Payer: Medicare Other | Admitting: Family Medicine

## 2012-07-03 VITALS — BP 138/74 | HR 55 | Temp 98.0°F | Resp 16 | Ht 64.0 in | Wt 180.6 lb

## 2012-07-03 DIAGNOSIS — H9319 Tinnitus, unspecified ear: Secondary | ICD-10-CM

## 2012-07-03 DIAGNOSIS — H9190 Unspecified hearing loss, unspecified ear: Secondary | ICD-10-CM

## 2012-07-03 DIAGNOSIS — F513 Sleepwalking [somnambulism]: Secondary | ICD-10-CM

## 2012-07-03 DIAGNOSIS — L03317 Cellulitis of buttock: Secondary | ICD-10-CM

## 2012-07-03 DIAGNOSIS — L0231 Cutaneous abscess of buttock: Secondary | ICD-10-CM

## 2012-07-03 DIAGNOSIS — F515 Nightmare disorder: Secondary | ICD-10-CM

## 2012-07-03 DIAGNOSIS — R29890 Loss of height: Secondary | ICD-10-CM

## 2012-07-03 DIAGNOSIS — R413 Other amnesia: Secondary | ICD-10-CM

## 2012-07-03 MED ORDER — CALCIUM CARB-CHOLECALCIFEROL 500-400 MG-UNIT PO TABS: 1.0000 | ORAL_TABLET | Freq: Two times a day (BID) | ORAL | Status: DC

## 2012-07-03 MED ORDER — CEPHALEXIN 500 MG PO CAPS: 500.0000 mg | ORAL_CAPSULE | Freq: Three times a day (TID) | ORAL | Status: AC

## 2012-07-03 NOTE — Progress Notes (Signed)
  Subjective:    Patient ID: Rebecca Cross, female    DOB: June 26, 1941, 71 y.o.   MRN: [id number]  HPI  Pt presents to clinic with multiple concerns 1- Noticed a bump on her R buttocks last pm - she does not have pain 2- noticed hearing loss would like to be referred to audiologist - also having tinnitus at time 3- noticed she has lost 1 in in height and knows she has lost bone density and needs another bone density done. 4- needs another referral to neurologist - for memory loss, sleep disturbance and sleep walking  Review of Systems     Objective:   Physical Exam  Constitutional: She is oriented to person, place, and time. She appears well-developed and well-nourished.  HENT:  Head: Normocephalic and atraumatic.  Right Ear: External ear normal.  Left Ear: External ear normal.  Pulmonary/Chest: Effort normal.  Neurological: She is alert and oriented to person, place, and time.  Skin: Skin is warm and dry.       1 cm erythematous, pustule without TTP  Psychiatric: She has a normal mood and affect. Her behavior is normal. Judgment and thought content normal.    Procedure: consent obtained - area cleaned with ETOH opened with 18g needle -purulent discharge from the area - sent for culture    Assessment & Plan:   1. Cellulitis and abscess of buttock  Wound culture, cephALEXin (KEFLEX) 500 MG capsule  2. Memory loss  Ambulatory referral to Neurology  3. Nightmares  Ambulatory referral to Neurology  4. Sleep walking  Ambulatory referral to Neurology  5. Loss of height  DG Bone Density, Calcium Carb-Cholecalciferol (CALCIUM 500 +D) 500-400 MG-UNIT TABS  6. Hearing loss  Ambulatory referral to Audiology  7. Tinnitus  Ambulatory referral to Audiology   Pt to use warm compresses and take antibiotics.  Answered questions and did referrals for patient.

## 2012-07-04 ENCOUNTER — Other Ambulatory Visit: Payer: Self-pay | Admitting: Physician Assistant

## 2012-07-04 DIAGNOSIS — Z1231 Encounter for screening mammogram for malignant neoplasm of breast: Secondary | ICD-10-CM

## 2012-07-05 NOTE — Progress Notes (Signed)
I saw and evaluated the patient. Agree with the note written by Benny Lennert PA-C

## 2012-07-07 LAB — WOUND CULTURE: Gram Stain: NONE SEEN

## 2012-07-23 ENCOUNTER — Ambulatory Visit
Admission: RE | Admit: 2012-07-23 | Discharge: 2012-07-23 | Disposition: A | Discharge status: Home / Self Care | Payer: Medicare Other | Source: Ambulatory Visit | Attending: Physician Assistant | Admitting: Physician Assistant

## 2012-07-23 DIAGNOSIS — R29890 Loss of height: Secondary | ICD-10-CM

## 2012-07-23 DIAGNOSIS — Z1231 Encounter for screening mammogram for malignant neoplasm of breast: Secondary | ICD-10-CM

## 2012-07-25 ENCOUNTER — Other Ambulatory Visit: Payer: Self-pay | Admitting: Physician Assistant

## 2012-07-25 ENCOUNTER — Ambulatory Visit
Admission: RE | Admit: 2012-07-25 | Discharge: 2012-07-25 | Disposition: A | Discharge status: Home / Self Care | Payer: Medicare Other | Source: Ambulatory Visit | Attending: Physician Assistant | Admitting: Physician Assistant

## 2012-07-25 DIAGNOSIS — R928 Other abnormal and inconclusive findings on diagnostic imaging of breast: Secondary | ICD-10-CM

## 2012-07-29 ENCOUNTER — Other Ambulatory Visit: Payer: Self-pay | Admitting: Neurology

## 2012-07-29 DIAGNOSIS — F09 Unspecified mental disorder due to known physiological condition: Secondary | ICD-10-CM

## 2012-08-01 ENCOUNTER — Ambulatory Visit
Admission: RE | Admit: 2012-08-01 | Discharge: 2012-08-01 | Disposition: A | Discharge status: Home / Self Care | Payer: Medicare Other | Source: Ambulatory Visit | Attending: Neurology | Admitting: Neurology

## 2012-08-01 DIAGNOSIS — F09 Unspecified mental disorder due to known physiological condition: Secondary | ICD-10-CM

## 2012-09-16 ENCOUNTER — Encounter: Payer: Self-pay | Admitting: Physician Assistant

## 2012-09-16 DIAGNOSIS — R413 Other amnesia: Secondary | ICD-10-CM | POA: Insufficient documentation

## 2012-11-03 ENCOUNTER — Ambulatory Visit (INDEPENDENT_AMBULATORY_CARE_PROVIDER_SITE_OTHER): Payer: Medicare Other | Admitting: Radiology

## 2012-11-03 DIAGNOSIS — Z23 Encounter for immunization: Secondary | ICD-10-CM

## 2013-02-25 ENCOUNTER — Encounter: Payer: Self-pay | Admitting: Radiology

## 2013-04-04 ENCOUNTER — Ambulatory Visit (INDEPENDENT_AMBULATORY_CARE_PROVIDER_SITE_OTHER): Payer: Medicare Other | Admitting: Family Medicine

## 2013-04-04 VITALS — BP 140/70 | HR 64 | Temp 98.1°F | Resp 16 | Ht 64.5 in | Wt 178.4 lb

## 2013-04-04 DIAGNOSIS — F419 Anxiety disorder, unspecified: Secondary | ICD-10-CM

## 2013-04-04 DIAGNOSIS — F411 Generalized anxiety disorder: Secondary | ICD-10-CM

## 2013-04-04 DIAGNOSIS — D1739 Benign lipomatous neoplasm of skin and subcutaneous tissue of other sites: Secondary | ICD-10-CM

## 2013-04-04 DIAGNOSIS — D171 Benign lipomatous neoplasm of skin and subcutaneous tissue of trunk: Secondary | ICD-10-CM

## 2013-04-04 NOTE — Progress Notes (Signed)
72 yo woman who had sudden chest pain this past week which lasted an hour or so.  She was told it was a panic attack by the EMS.  She cannot recall how long it lasted, what time of day, or which day it occurred.  Patient was seen by EMS and a cardiogram was performed.  She was given the choice of going to ED and she elected not to go. Since the day of the panic attack, many things have been going wrong.  Can't pay bills, computer won't work so she cannot give reasonable financial responses to those needing them.  Patient is normally seen by [facility].  She is very anxious about her finances.  Social:  Patient lives with dog and cat.  Has a distant cousin here in [place].  She used to work at [employer], but her memory was failing and her math skills have deteriorated perhaps 9 months ago.  She cannot live on $1000 social security and $900 rental income.  Owns own home (condo) since 2005 and she has a mortgage of $128K.   She has a car which is almost paid for.  Sister lives in [place] and also has home in [place].  We have been her primary care doctor Hal Hope).  She is worried about her thyroid because of a sensitive area on lower left anterior neck.  Also, her left hip has gotten bigger and baggier.  She has been seen by therapist at [facility] and Dr. Lesia Sago (did not diagnose her with alzheimers!)  Patient was given a prescription for Namenda by [facility] (cost$300 per month).  She demands her telling me all of her symptoms:  Nightmares, memory loss, sleep walking, anxiety about money, ridding house of hoarded items.  Objective:  Somewhat anxious but alert and cooperative, appropriate. Patient lists all of her problems to me.  Neck exam:  Normal Left hip:  15 x 15 cm subcutaneous, well defined soft tissue mass consistent with lipoma  Assessment:  Anxiety, normal thyroid, lipoma  Plan: surgical referral for biopsy of the presumed lipoma.  Signed,  Elvina Sidle, MD

## 2013-04-04 NOTE — Patient Instructions (Addendum)
Follow up re:  Namenda with Dr. Anne Hahn I am referring you to surgeon re:  Lipoma Consult [facility] re: anxiety medication.

## 2013-04-17 ENCOUNTER — Ambulatory Visit (INDEPENDENT_AMBULATORY_CARE_PROVIDER_SITE_OTHER): Payer: Medicare Other | Admitting: Surgery

## 2013-04-17 ENCOUNTER — Encounter (INDEPENDENT_AMBULATORY_CARE_PROVIDER_SITE_OTHER): Payer: Self-pay | Admitting: Surgery

## 2013-04-17 VITALS — BP 130/76 | HR 60 | Temp 97.5°F | Resp 18 | Wt 173.0 lb

## 2013-04-17 DIAGNOSIS — R29898 Other symptoms and signs involving the musculoskeletal system: Secondary | ICD-10-CM

## 2013-04-17 DIAGNOSIS — F419 Anxiety disorder, unspecified: Secondary | ICD-10-CM

## 2013-04-17 DIAGNOSIS — R2242 Localized swelling, mass and lump, left lower limb: Secondary | ICD-10-CM | POA: Insufficient documentation

## 2013-04-17 DIAGNOSIS — N824 Other female intestinal-genital tract fistulae: Secondary | ICD-10-CM

## 2013-04-17 NOTE — Patient Instructions (Addendum)
See the Handout(s) we gave you.  Consider surgery.  Please call our office at [phone] if you wish to schedule surgery or if you have further questions / concerns.   Lipoma A lipoma is a noncancerous (benign) tumor composed of fat cells. They are usually found under the skin (subcutaneous). A lipoma may occur in any tissue of the body that contains fat. Common areas for lipomas to appear include the back, shoulders, buttocks, and thighs. Lipomas are a very common soft tissue growth. They are soft and grow slowly. Most problems caused by a lipoma depend on where it is growing. DIAGNOSIS  A lipoma can be diagnosed with a physical exam. These tumors rarely become cancerous, but radiographic studies can help determine this for certain. Studies used may include:  Computerized X-ray scans (CT or CAT scan).  Computerized magnetic scans (MRI). TREATMENT  Small lipomas that are not causing problems may be watched. If a lipoma continues to enlarge or causes problems, removal is often the best treatment. Lipomas can also be removed to improve appearance. Surgery is done to remove the fatty cells and the surrounding capsule. Most often, this is done with medicine that numbs the area (local anesthetic). The removed tissue is examined under a microscope to make sure it is not cancerous. Keep all follow-up appointments with your caregiver. SEEK MEDICAL CARE IF:   The lipoma becomes larger or hard.  The lipoma becomes painful, red, or increasingly swollen. These could be signs of infection or a more serious condition. Document Released: 10/27/2002 Document Revised: 01/29/2012 Document Reviewed: 04/08/2010 [facility] Patient Information 2014 [place], [place].

## 2013-04-17 NOTE — Progress Notes (Signed)
Subjective:     Patient ID: [name], female   DOB: Aug 27, 1941, 72 y.o.   MRN: [id number]  HPI  Rebecca Cross  [birth year] [id number]  Patient Care Team: Dois Davenport, MD as PCP - General (Family Medicine) Elvina Sidle, MD as Referring Physician (Family Medicine)  This patient is a 72 y.o.female who presents today for surgical evaluation at the request of Dr. Iver Nestle.   Reason for visit: Mass on the right buttock  Pleasant woman.  Has felt a lump on her right buttock/thigh for decades.  Thinks it has gotten larger over time.  Becoming more sensitive and uncomfortable.  Mention it to her primary care physician office.  I recommended consideration of surgical evaluation.  Felt a tinier sore spot on her right side.  No history of other masses.  No history of infections.  Also history of diverticulitis with spontaneous draining abscess and colovaginal fistula.  Chronic but mild low flow drainage from the vagina.  That is stable.  She declined surgery to treat this two years ago and continues to do so.  Patient Active Problem List   Diagnosis Date Noted  . 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 Sigmoid diverticulitis 06/29/2011  . Fibrocystic breast disease 06/19/2011    Past Medical History  Diagnosis Date  . Diverticulitis of sigmoid colon     known diverticulitis of sigmoid  . Vaginal fistula     between vagina and colon  . Asthma   . Anxiety   . Depression   . Allergy   . COPD (chronic obstructive pulmonary disease)   . Cancer   . Seizures     Past Surgical History  Procedure Laterality Date  . Ankle surgery      left- pin put in  . Wisdom tooth extraction    . Tubal ligation    . Nose surgery    . Cataract extraction    . Skin cancer excision      x3, face and back  . Breast biopsy  1960    on lump, left  . Abdominal hysterectomy  1982    vaginal    History   Social History  . Marital Status: Divorced    Spouse  Name: N/A    Number of Children: N/A  . Years of Education: N/A   Occupational History  . Not on file.   Social History Main Topics  . Smoking status: Former Smoker    Quit date: 06/29/1963  . Smokeless tobacco: Not on file  . Alcohol Use: No  . Drug Use: No  . Sexually Active: No   Other Topics Concern  . Not on file   Social History Narrative  . No narrative on file    Family History  Problem Relation Age of Onset  . Cancer Mother     breast  . COPD Mother   . Cancer Father     skin/prostate ca  . Cancer Sister     [place] cell  . Cancer Paternal Grandmother   . Cancer Paternal Grandfather     Current Outpatient Prescriptions  Medication Sig Dispense Refill  . aspirin 81 MG tablet Take 81 mg by mouth daily.        . Multiple Vitamin (MULTIVITAMIN) capsule Take 1 capsule by mouth daily.        . Calcium Carb-Cholecalciferol (CALCIUM 500 +D) 500-400 MG-UNIT TABS Take 1 tablet by mouth 2 (two) times daily.  60 tablet  11  .  Fexofenadine HCl (ALLEGRA PO) Take by mouth as needed.        . fish oil-omega-3 fatty acids 1000 MG capsule Take 2 g by mouth daily.         No current facility-administered medications for this visit.     Allergies  Allergen Reactions  . Amoxicillin Nausea And Vomiting  . Erythromycin   . Hydrocodone   . Oxycodone   . Sulfa Antibiotics Itching    BP 130/76  Pulse 60  Temp(Src) 97.5 F (36.4 C)  Resp 18  Wt 173 lb (78.472 kg)  BMI 29.25 kg/m2  SpO2 97%  No results found.   Review of Systems  Constitutional: Negative for fever, chills, diaphoresis, appetite change and fatigue.  HENT: Negative for ear pain, sore throat, trouble swallowing, neck pain and ear discharge.   Eyes: Negative for photophobia, discharge and visual disturbance.  Respiratory: Negative for cough, choking, chest tightness and shortness of breath.   Cardiovascular: Negative for chest pain and palpitations.  Gastrointestinal: Negative for nausea, vomiting,  abdominal pain, diarrhea, constipation, anal bleeding and rectal pain.  Endocrine: Negative for cold intolerance and heat intolerance.  Genitourinary: Negative for dysuria, frequency and difficulty urinating.  Musculoskeletal: Negative for myalgias and gait problem.  Skin: Negative for color change, pallor and rash.  Allergic/Immunologic: Negative for environmental allergies, food allergies and immunocompromised state.  Neurological: Negative for dizziness, speech difficulty, weakness and numbness.  Hematological: Negative for adenopathy.  Psychiatric/Behavioral: Positive for dysphoric mood. Negative for confusion and agitation. The patient is nervous/anxious.        Objective:   Physical Exam  Constitutional: She is oriented to person, place, and time. She appears well-developed and well-nourished. No distress.  HENT:  Head: Normocephalic.  Mouth/Throat: Oropharynx is clear and moist. No oropharyngeal exudate.  Eyes: Conjunctivae and EOM are normal. Pupils are equal, round, and reactive to light. No scleral icterus.  Neck: Normal range of motion. Neck supple. No tracheal deviation present.  Cardiovascular: Normal rate, regular rhythm and intact distal pulses.   Pulmonary/Chest: Effort normal and breath sounds normal. No stridor. No respiratory distress. She exhibits no tenderness.  Abdominal: Soft. She exhibits no distension and no mass. There is no tenderness. Hernia confirmed negative in the right inguinal area and confirmed negative in the left inguinal area.  Genitourinary: No vaginal discharge found.  Musculoskeletal: Normal range of motion. She exhibits no tenderness.       Right elbow: She exhibits normal range of motion.       Left elbow: She exhibits normal range of motion.       Right wrist: She exhibits normal range of motion.       Left wrist: She exhibits normal range of motion.       Right hand: Normal strength noted.       Left hand: Normal strength noted.        Legs: Lymphadenopathy:       Head (right side): No posterior auricular adenopathy present.       Head (left side): No posterior auricular adenopathy present.    She has no cervical adenopathy.    She has no axillary adenopathy.       Right: No inguinal adenopathy present.       Left: No inguinal adenopathy present.  Neurological: She is alert and oriented to person, place, and time. No cranial nerve deficit. She exhibits normal muscle tone. Coordination normal.  Skin: Skin is warm and dry. No rash noted. She is  not diaphoretic. No erythema.  Psychiatric: She has a normal mood and affect. Her speech is normal and behavior is normal. Judgment and thought content normal. Her mood appears not anxious. Cognition and memory are normal. She does not exhibit a depressed mood.  Writes everything down In a notebook.  Calm.       Assessment:     Soft ellipsoid mass on left posterior buttock/thigh.  Most likely a lipoma.     Plan:     Given the fact it has gotten larger and is more sensitive, I think it is agreeable to consider excision.  Most likely will need a temporary subcutaneous drain to avoid seroma/hematoma formation given its large size.  I discussed with her:  The pathophysiology of skin & subcutaneous masses was discussed.  Natural history risks without surgery were discussed.  I recommended surgery to remove the mass.  I explained the technique of removal with use of local anesthesia & possible need for more aggressive sedation/anesthesia for patient comfort.    Risks such as bleeding, infection, heart attack, death, and other risks were discussed.  I noted a good likelihood this will help address the problem.   Possibility that this will not correct all symptoms was explained. Possibility of regrowth/recurrence of the mass was discussed.  We will work to minimize complications. Questions were answered.  The patient expresses understanding & wishes to avoid surgery since it is not horribly  painful to her.  She will think about things and let me know.

## 2013-06-12 ENCOUNTER — Ambulatory Visit (INDEPENDENT_AMBULATORY_CARE_PROVIDER_SITE_OTHER): Payer: Medicare Other | Admitting: Emergency Medicine

## 2013-06-12 VITALS — BP 118/66 | HR 62 | Temp 99.6°F | Resp 18 | Ht 64.0 in | Wt 168.0 lb

## 2013-06-12 DIAGNOSIS — N3000 Acute cystitis without hematuria: Secondary | ICD-10-CM

## 2013-06-12 DIAGNOSIS — R300 Dysuria: Secondary | ICD-10-CM

## 2013-06-12 DIAGNOSIS — R350 Frequency of micturition: Secondary | ICD-10-CM

## 2013-06-12 LAB — POCT UA - MICROSCOPIC ONLY
Casts, Ur, LPF, POC: NEGATIVE
Crystals, Ur, HPF, POC: NEGATIVE
Yeast, UA: NEGATIVE

## 2013-06-12 LAB — POCT URINALYSIS DIPSTICK
Glucose, UA: NEGATIVE
Nitrite, UA: NEGATIVE
Protein, UA: 100
Spec Grav, UA: 1.025
Urobilinogen, UA: 1

## 2013-06-12 MED ORDER — CIPROFLOXACIN HCL 500 MG PO TABS: 500.0000 mg | ORAL_TABLET | Freq: Two times a day (BID) | ORAL | Status: DC

## 2013-06-12 MED ORDER — PHENAZOPYRIDINE HCL 200 MG PO TABS: 200.0000 mg | ORAL_TABLET | Freq: Three times a day (TID) | ORAL | Status: DC | PRN

## 2013-06-12 NOTE — Patient Instructions (Addendum)
Urinary Tract Infection  Urinary tract infections (UTIs) can develop anywhere along your urinary tract. Your urinary tract is your body's drainage system for removing wastes and extra water. Your urinary tract includes two kidneys, two ureters, a bladder, and a urethra. Your kidneys are a pair of bean-shaped organs. Each kidney is about the size of your fist. They are located below your ribs, one on each side of your spine.  CAUSES  Infections are caused by microbes, which are microscopic organisms, including fungi, viruses, and bacteria. These organisms are so small that they can only be seen through a microscope. Bacteria are the microbes that most commonly cause UTIs.  SYMPTOMS   Symptoms of UTIs may vary by age and gender of the patient and by the location of the infection. Symptoms in young women typically include a frequent and intense urge to urinate and a painful, burning feeling in the bladder or urethra during urination. Older women and men are more likely to be tired, shaky, and weak and have muscle aches and abdominal pain. A fever may mean the infection is in your kidneys. Other symptoms of a kidney infection include pain in your back or sides below the ribs, nausea, and vomiting.  DIAGNOSIS  To diagnose a UTI, your caregiver will ask you about your symptoms. Your caregiver also will ask to provide a urine sample. The urine sample will be tested for bacteria and white blood cells. White blood cells are made by your body to help fight infection.  TREATMENT   Typically, UTIs can be treated with medication. Because most UTIs are caused by a bacterial infection, they usually can be treated with the use of antibiotics. The choice of antibiotic and length of treatment depend on your symptoms and the type of bacteria causing your infection.  HOME CARE INSTRUCTIONS   If you were prescribed antibiotics, take them exactly as your caregiver instructs you. Finish the medication even if you feel better after you  have only taken some of the medication.   Drink enough water and fluids to keep your urine clear or pale yellow.   Avoid caffeine, tea, and carbonated beverages. They tend to irritate your bladder.   Empty your bladder often. Avoid holding urine for long periods of time.   Empty your bladder before and after sexual intercourse.   After a bowel movement, women should cleanse from front to back. Use each tissue only once.  SEEK MEDICAL CARE IF:    You have back pain.   You develop a fever.   Your symptoms do not begin to resolve within 3 days.  SEEK IMMEDIATE MEDICAL CARE IF:    You have severe back pain or lower abdominal pain.   You develop chills.   You have nausea or vomiting.   You have continued burning or discomfort with urination.  MAKE SURE YOU:    Understand these instructions.   Will watch your condition.   Will get help right away if you are not doing well or get worse.  Document Released: 08/16/2005 Document Revised: 05/07/2012 Document Reviewed: 12/15/2011  [employer] Patient Information 2014 [employer].

## 2013-06-12 NOTE — Progress Notes (Signed)
[facility] [address] [place] [address] [phone]  Date:  06/12/2013   Name:  Rebecca Cross   DOB:  1941-09-23   MRN:  [id number]  PCP:  Dois Davenport., MD    Chief Complaint: Urinary Frequency and Dysuria   History of Present Illness:  Rebecca Cross is a 72 y.o. very pleasant female patient who presents with the following:  Dysuria, urgency and frequency started this week.  No fever or chills, no nausea or vomiting.  Has gravity incontinence chronically.  No GI or GYN symptoms.  No improvement with over the counter medications or other home remedies. Denies other complaint or health concern today. No abdominal pain.    Patient Active Problem List   Diagnosis Date Noted  . Mass of left posterior buttock/thigh 18x12cm (probable lipoma) 04/17/2013  . Anxiety   . 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 Sigmoid diverticulitis 06/29/2011  . Fibrocystic breast disease 06/19/2011    Past Medical History  Diagnosis Date  . Diverticulitis of sigmoid colon     known diverticulitis of sigmoid  . Vaginal fistula     between vagina and colon  . Asthma   . Anxiety   . Depression   . Allergy   . COPD (chronic obstructive pulmonary disease)   . Cancer   . Seizures     Past Surgical History  Procedure Laterality Date  . Ankle surgery      left- pin put in  . Wisdom tooth extraction    . Tubal ligation    . Nose surgery    . Cataract extraction    . Skin cancer excision      x3, face and back  . Breast biopsy  1960    on lump, left  . Abdominal hysterectomy  1982    vaginal    History  Substance Use Topics  . Smoking status: Former Smoker    Quit date: 06/29/1963  . Smokeless tobacco: Not on file  . Alcohol Use: No    Family History  Problem Relation Age of Onset  . Cancer Mother     breast  . COPD Mother   . Cancer Father     skin/prostate ca  . Cancer Sister     [place] cell  . Cancer Paternal  Grandmother   . Cancer Paternal Grandfather     Allergies  Allergen Reactions  . Amoxicillin Nausea And Vomiting  . Erythromycin   . Hydrocodone   . Oxycodone   . Sulfa Antibiotics Itching    Medication list has been reviewed and updated.  Current Outpatient Prescriptions on File Prior to Visit  Medication Sig Dispense Refill  . aspirin 81 MG tablet Take 81 mg by mouth daily.        . Calcium Carb-Cholecalciferol (CALCIUM 500 +D) 500-400 MG-UNIT TABS Take 1 tablet by mouth 2 (two) times daily.  60 tablet  11  . Fexofenadine HCl (ALLEGRA PO) Take by mouth as needed.        . fish oil-omega-3 fatty acids 1000 MG capsule Take 2 g by mouth daily.        . Multiple Vitamin (MULTIVITAMIN) capsule Take 1 capsule by mouth daily.         No current facility-administered medications on file prior to visit.    Review of Systems:  As per HPI, otherwise negative.    Physical Examination: Filed Vitals:   06/12/13 1519  BP:  118/66  Pulse: 62  Temp: 99.6 F (37.6 C)  Resp: 18   Filed Vitals:   06/12/13 1519  Height: 5\' 4"  (1.626 m)  Weight: 168 lb (76.204 kg)   Body mass index is 28.82 kg/(m^2). Ideal Body Weight: Weight in (lb) to have BMI = 25: 145.3  GEN: WDWN, NAD, Non-toxic, A & O x 3 HEENT: Atraumatic, Normocephalic. Neck supple. No masses, No LAD. Ears and Nose: No external deformity. CV: RRR, No M/G/R. No JVD. No thrill. No extra heart sounds. PULM: CTA B, no wheezes, crackles, rhonchi. No retractions. No resp. distress. No accessory muscle use. ABD: S, NT, ND, +BS. No rebound. No HSM. EXTR: No c/c/e NEURO Normal gait.  PSYCH: Normally interactive. Conversant. Not depressed or anxious appearing.  Calm demeanor.    Assessment and Plan: Cystitis cipro Pyridium Increase fluids   Signed,  Phillips Odor, MD   Results for orders placed in visit on 06/12/13  POCT URINALYSIS DIPSTICK      Result Value Range   Color, UA yellow     Clarity, UA clear      Glucose, UA neg     Bilirubin, UA neg     Ketones, UA 40     Spec Grav, UA 1.025     Blood, UA small     pH, UA 5.5     Protein, UA 100     Urobilinogen, UA 1.0     Nitrite, UA neg     Leukocytes, UA Trace    POCT UA - MICROSCOPIC ONLY      Result Value Range   WBC, Ur, HPF, POC 5-10     RBC, urine, microscopic 5-10     Bacteria, U Microscopic trace     Mucus, UA moderate     Epithelial cells, urine per micros 0-5     Crystals, Ur, HPF, POC neg     Casts, Ur, LPF, POC neg     Yeast, UA neg

## 2013-06-17 ENCOUNTER — Telehealth (INDEPENDENT_AMBULATORY_CARE_PROVIDER_SITE_OTHER): Payer: Self-pay

## 2013-06-17 ENCOUNTER — Other Ambulatory Visit (INDEPENDENT_AMBULATORY_CARE_PROVIDER_SITE_OTHER): Payer: Self-pay | Admitting: Surgery

## 2013-06-17 NOTE — Telephone Encounter (Signed)
Orders placed.  Post sheet sent

## 2013-06-17 NOTE — Telephone Encounter (Signed)
Pt calling to get scheduled for surgery to have mass on buttocks excised by Dr Michaell Cowing. Please complete orders in epic.

## 2013-06-17 NOTE — Telephone Encounter (Signed)
Message copied by Ethlyn Gallery on Tue Jun 17, 2013  9:30 AM ------      Message from: Marin Shutter      Created: Fri Jun 13, 2013 10:16 AM      Regarding: Dr. Michaell Cowing      Contact: [phone]       Pt is ready to proceed with sx.  Thx ------

## 2013-06-23 ENCOUNTER — Telehealth (INDEPENDENT_AMBULATORY_CARE_PROVIDER_SITE_OTHER): Payer: Self-pay | Admitting: Surgery

## 2013-06-23 NOTE — Telephone Encounter (Signed)
Spoke with pt she is not ready to schedule her surgery she will call when wants to schedule. Placed in pending folder

## 2013-07-31 ENCOUNTER — Ambulatory Visit (INDEPENDENT_AMBULATORY_CARE_PROVIDER_SITE_OTHER): Payer: Medicare Other | Admitting: Family Medicine

## 2013-07-31 ENCOUNTER — Encounter: Payer: Self-pay | Admitting: Family Medicine

## 2013-07-31 VITALS — BP 112/68 | HR 64 | Temp 98.1°F | Resp 16 | Ht 64.0 in | Wt 162.4 lb

## 2013-07-31 DIAGNOSIS — Z139 Encounter for screening, unspecified: Secondary | ICD-10-CM

## 2013-07-31 DIAGNOSIS — R079 Chest pain, unspecified: Secondary | ICD-10-CM

## 2013-07-31 DIAGNOSIS — R4189 Other symptoms and signs involving cognitive functions and awareness: Secondary | ICD-10-CM

## 2013-07-31 DIAGNOSIS — Z0000 Encounter for general adult medical examination without abnormal findings: Secondary | ICD-10-CM

## 2013-07-31 DIAGNOSIS — Z8639 Personal history of other endocrine, nutritional and metabolic disease: Secondary | ICD-10-CM

## 2013-07-31 DIAGNOSIS — Z862 Personal history of diseases of the blood and blood-forming organs and certain disorders involving the immune mechanism: Secondary | ICD-10-CM

## 2013-07-31 DIAGNOSIS — Z23 Encounter for immunization: Secondary | ICD-10-CM

## 2013-07-31 DIAGNOSIS — M810 Age-related osteoporosis without current pathological fracture: Secondary | ICD-10-CM

## 2013-07-31 LAB — CBC WITH DIFFERENTIAL/PLATELET
Basophils Relative: 0 % (ref 0–1)
Eosinophils Absolute: 0.2 10*3/uL (ref 0.0–0.7)
Eosinophils Relative: 2 % (ref 0–5)
Lymphs Abs: 1.8 10*3/uL (ref 0.7–4.0)
MCH: 29.5 pg (ref 26.0–34.0)
MCHC: 33.7 g/dL (ref 30.0–36.0)
MCV: 87.7 fL (ref 78.0–100.0)
Neutrophils Relative %: 75 % (ref 43–77)
Platelets: 177 10*3/uL (ref 150–400)
RBC: 4.4 MIL/uL (ref 3.87–5.11)

## 2013-07-31 LAB — POCT URINALYSIS DIPSTICK
Glucose, UA: NEGATIVE
Ketones, UA: NEGATIVE
Nitrite, UA: NEGATIVE

## 2013-07-31 LAB — POCT UA - MICROSCOPIC ONLY

## 2013-07-31 NOTE — Progress Notes (Signed)
Subjective:    Patient ID: Rebecca Cross, female    DOB: [birth year], 72 y.o.   MRN: [id number]  HPI  This 72 y.o. Cauc female is here for [facility] subsequent exam. She has recently  Sold  her house (at the insistence of her children) and moved to [place], a residential facility.    She has adjusted and notes that nutrition and sleep are better since moving. She has concerns  about cognitive dysfunction. 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    She last had labs in 2012; potassium was below normal and pt states it is probably worse now  since she is not taking any medication. Pt reports  hx of goiter back in the 1980s.   Pt  Also has persistent blood and WBCs in urine; this is due to colovaginal fistula (see hospital-  ization Feb 2012).   HCM: MMG- current; due Sept or Oct 2014. (R breast mass>> cyst).           DEXA- Sept 2013 (low bone mass).           IMM- Zostavax ?           Vision- > 4 years ago.   Review of Systems  Constitutional: Positive for activity change, appetite change and unexpected weight change.  HENT: Positive for hearing loss, neck stiffness, voice change and tinnitus.   Eyes: Positive for visual disturbance.       Color changes.  Respiratory: Negative.   Cardiovascular: Positive for chest pain.       Pt describes these as "twinges"- occurred 3 times within last month  Gastrointestinal: Negative.   Endocrine: Negative.   Genitourinary: Positive for urgency and frequency.  Musculoskeletal: Positive for joint swelling. Negative for myalgias and arthralgias.  Skin: Negative.   Allergic/Immunologic: Negative.   Neurological: Negative.   Hematological: Negative.   Psychiatric/Behavioral: Positive for decreased concentration.       Pt reports memory problems and has had cognitive decline for several years. She reports inability to use clocks and calendars and short term memory  problems.  HANDS Questionnaire for Depression screening score= 4       Objective:   Physical Exam  Nursing note and vitals reviewed. Constitutional: She is oriented to person, place, and time. Vital signs are normal. She appears well-developed and well-nourished. No distress.  HENT:  Head: Normocephalic and atraumatic.  Right Ear: Hearing, external ear and ear canal normal. Tympanic membrane is scarred. Tympanic membrane is not erythematous and not bulging. No middle ear effusion.  Left Ear: Hearing, external ear and ear canal normal. Tympanic membrane is scarred. Tympanic membrane is not erythematous and not bulging.  No middle ear effusion.  Nose: Nose normal. No nasal deformity or septal deviation. Right sinus exhibits no maxillary sinus tenderness and no frontal sinus tenderness. Left sinus exhibits no maxillary sinus tenderness and no frontal sinus tenderness.  Mouth/Throat: Uvula is midline, oropharynx is clear and moist and mucous membranes are normal. No oral lesions. Normal dentition.  Eyes: Conjunctivae, EOM and lids are normal. Pupils are equal, round, and reactive to light. No scleral icterus.  Fundoscopic exam:      The right eye shows red reflex.       The left eye shows red reflex.  Fundoscopic exam difficult- Cataracts ?  Neck: Full passive range of motion without pain. Neck supple. No JVD present. No spinous process tenderness and no  muscular tenderness present. Carotid bruit is not present. Normal range of motion present. No mass and no thyromegaly present.  Cardiovascular: Regular rhythm, S1 normal, S2 normal, normal heart sounds, intact distal pulses and normal pulses.   No extrasystoles are present. PMI is not displaced.  Exam reveals no gallop and no friction rub.   No murmur heard. Pulmonary/Chest: Effort normal and breath sounds normal. No respiratory distress.  Abdominal: Soft. Normal appearance and bowel sounds are normal. She exhibits no distension, no abdominal  bruit, no pulsatile midline mass and no mass. There is no hepatosplenomegaly. There is no tenderness. There is no guarding and no CVA tenderness. No hernia.  Genitourinary: Rectum normal. Rectal exam shows no external hemorrhoid, no fissure, no mass, no tenderness and anal tone normal. Guaiac negative stool. No breast swelling, tenderness or discharge.  NEFG; no pelvic exam performed.  Musculoskeletal: She exhibits no edema.       Cervical back: She exhibits decreased range of motion, tenderness and spasm. She exhibits no swelling, no deformity and no pain.  Lymphadenopathy:       Head (right side): No submental, no submandibular, no posterior auricular and no occipital adenopathy present.       Head (left side): No submental, no submandibular, no posterior auricular and no occipital adenopathy present.    She has no cervical adenopathy.    She has no axillary adenopathy.       Right: No inguinal and no supraclavicular adenopathy present.       Left: No inguinal and no supraclavicular adenopathy present.  Neurological: She is alert and oriented to person, place, and time. She has normal strength and normal reflexes. She displays no atrophy. No cranial nerve deficit or sensory deficit. She exhibits normal muscle tone. Coordination and gait normal.  Skin: Skin is warm, dry and intact. No rash noted. She is not diaphoretic. No cyanosis or erythema. No pallor. Nails show no clubbing.  Multiple pigmented lesions c/w Seb Keratoses. Lower ext varicose veins.  Psychiatric: Her behavior is normal. Judgment and thought content normal. Her mood appears not anxious. Her affect is blunt. Her affect is not inappropriate. Her speech is delayed. Cognition and memory are impaired.  Pt is aware of cognitive impairments.     Results for orders placed in visit on 07/31/13  IFOBT (OCCULT BLOOD)      Result Value Range   IFOBT Negative    POCT URINALYSIS DIPSTICK      Result Value Range   Color, UA yellow      Clarity, UA clear     Glucose, UA neg     Bilirubin, UA small     Ketones, UA neg     Spec Grav, UA >=1.030     Blood, UA moderarte     pH, UA 5.0     Protein, UA 30     Urobilinogen, UA 0.2     Nitrite, UA neg     Leukocytes, UA Trace    POCT UA - MICROSCOPIC ONLY      Result Value Range   WBC, Ur, HPF, POC 3-15     RBC, urine, microscopic 1-6     Bacteria, U Microscopic trace     Mucus, UA moderate     Epithelial cells, urine per micros 3-10     Crystals, Ur, HPF, POC neg     Casts, Ur, LPF, POC neg     Yeast, UA neg      ECG: Sinus bradycardia; nonspecific  ST-T changes.  Borderline ECG.     Assessment & Plan:  Routine general medical examination at a health care facility - Plan: IFOBT POC (occult bld, rslt in office), POCT urinalysis dipstick, POCT UA - Microscopic Only  Cognitive changes- Advised pt that it would be best if she return for visit devoted to assessing this problem; she agreed.  Chest pain, unspecified - Plan: CBC with Differential, EKG 12-Lead  History of hypokalemia - Plan: Comprehensive metabolic panel  Personal history of goiter - Plan: TSH, T3, Free  History of anemia - Plan: CBC with Differential  Osteoporosis, unspecified - Plan: Vit D  25 hydroxy (rtn osteoporosis monitoring)  Need for prophylactic vaccination against Streptococcus pneumoniae (pneumococcus) - Plan: Flu Vaccine QUAD 36+ mos IM  .

## 2013-07-31 NOTE — Patient Instructions (Addendum)
Keeping You Healthy  Get These Tests  Blood Pressure- Have your blood pressure checked by your healthcare provider at least once a year.  Normal blood pressure is 120/80.  Weight- Have your body mass index (BMI) calculated to screen for obesity.  BMI is a measure of body fat based on height and weight.  You can calculate your own BMI at https://www.west-esparza.com/  Cholesterol- Have your cholesterol checked every year.  Diabetes- Have your blood sugar checked every year if you have high blood pressure, high cholesterol, a family history of diabetes or if you are overweight.  Pap Smear- Have a pap smear every 1 to 3 years if you have been sexually active.  If you are older than 65 and recent pap smears have been normal you may not need additional pap smears.  In addition, if you have had a hysterectomy  For benign disease additional pap smears are not necessary.  Mammogram-Yearly mammograms are essential for early detection of breast cancer.  This test is due within next month.  Screening for Colon Cancer- Colonoscopy starting at age 59. Screening may begin sooner depending on your family history and other health conditions.  Follow up colonoscopy as directed by your Gastroenterologist.  Screening for Osteoporosis- Screening begins at age 71 with bone density scanning, sooner if you are at higher risk for developing Osteoporosis. DEXA was done 1 year ago; this can be repeated next year (2015). You have low bone mass and need to strengthen your bones with calcium and Vitamin D.  Get these medicines  Calcium with Vitamin D- Your body requires 1200-1500 mg of Calcium a day and [phone] IU of Vitamin D a day.  You can only absorb 500 mg of Calcium at a time therefore Calcium must be taken in 2 or 3 separate doses throughout the day.  Hormones- Hormone therapy has been associated with increased risk for certain cancers and heart disease.  Talk to your healthcare provider about if you need relief from  menopausal symptoms.  Aspirin- Ask your healthcare provider about taking Aspirin to prevent Heart Disease and Stroke.  Get these Immuniztions  Flu shot- Every fall  Pneumonia shot- Once after the age of 81; if you are younger ask your healthcare provider if you need a pneumonia shot.  Tetanus- Every ten years.  Zostavax- Once after the age of 92 to prevent shingles.  Take these steps  Don't smoke- Your healthcare provider can help you quit. For tips on how to quit, ask your healthcare provider or go to www.smokefree.gov or call 1-800 QUIT-NOW.  Be physically active- Exercise 5 days a week for a minimum of 30 minutes.  If you are not already physically active, start slow and gradually work up to 30 minutes of moderate physical activity.  Try walking, dancing, bike riding, swimming, etc.  Eat a healthy diet- Eat a variety of healthy foods such as fruits, vegetables, whole grains, low fat milk, low fat cheeses, yogurt, lean meats, chicken, fish, eggs, dried beans, tofu, etc.  For more information go to www.thenutritionsource.org  Dental visit- Brush and floss teeth twice daily; visit your dentist twice a year.  Eye exam- Visit your Optometrist or Ophthalmologist yearly.  Drink alcohol in moderation- Limit alcohol intake to one drink or less a day.  Never drink and drive.  Depression- Your emotional health is as important as your physical health.  If you're feeling down or losing interest in things you normally enjoy, please talk to your healthcare provider.  Seat  Belts- can save your life; always wear one  Smoke/Carbon Monoxide detectors- These detectors need to be installed on the appropriate level of your home.  Replace batteries at least once a year.  Violence- If anyone is threatening or hurting you, please tell your healthcare provider.  Living Will/ Health care power of attorney- Discuss with your healthcare provider and family.   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 There is no treatment for this. Try to stay healthy with good nutrition, restful sleep and staying active.  You need to have your eyesight checked by an [facility] specialist.

## 2013-08-01 LAB — COMPREHENSIVE METABOLIC PANEL
ALT: 12 U/L (ref 0–35)
AST: 17 U/L (ref 0–37)
Albumin: 3.8 g/dL (ref 3.5–5.2)
Alkaline Phosphatase: 71 U/L (ref 39–117)
Glucose, Bld: 83 mg/dL (ref 70–99)
Potassium: 3.5 mEq/L (ref 3.5–5.3)
Sodium: 142 mEq/L (ref 135–145)
Total Bilirubin: 0.5 mg/dL (ref 0.3–1.2)
Total Protein: 6.6 g/dL (ref 6.0–8.3)

## 2013-08-01 LAB — VITAMIN D 25 HYDROXY (VIT D DEFICIENCY, FRACTURES): Vit D, 25-Hydroxy: 41 ng/mL (ref 30–89)

## 2013-08-02 ENCOUNTER — Encounter: Payer: Self-pay | Admitting: Family Medicine

## 2013-08-02 DIAGNOSIS — Z8639 Personal history of other endocrine, nutritional and metabolic disease: Secondary | ICD-10-CM | POA: Insufficient documentation

## 2013-08-03 NOTE — Progress Notes (Signed)
Quick Note:  Please notify pt that results are normal.   Provide pt with copy of labs. ______ 

## 2013-08-04 ENCOUNTER — Encounter: Payer: Self-pay | Admitting: Family Medicine

## 2013-08-20 ENCOUNTER — Emergency Department (HOSPITAL_COMMUNITY): Payer: Medicare Other

## 2013-08-20 ENCOUNTER — Encounter (HOSPITAL_COMMUNITY): Payer: Self-pay | Admitting: *Deleted

## 2013-08-20 ENCOUNTER — Emergency Department (HOSPITAL_COMMUNITY)
Admission: EM | Admit: 2013-08-20 | Discharge: 2013-08-20 | Disposition: A | Discharge status: Home / Self Care | Payer: Medicare Other | Attending: Emergency Medicine | Admitting: Emergency Medicine

## 2013-08-20 DIAGNOSIS — Z23 Encounter for immunization: Secondary | ICD-10-CM | POA: Insufficient documentation

## 2013-08-20 DIAGNOSIS — Z9889 Other specified postprocedural states: Secondary | ICD-10-CM | POA: Insufficient documentation

## 2013-08-20 DIAGNOSIS — Z8719 Personal history of other diseases of the digestive system: Secondary | ICD-10-CM | POA: Insufficient documentation

## 2013-08-20 DIAGNOSIS — J449 Chronic obstructive pulmonary disease, unspecified: Secondary | ICD-10-CM | POA: Insufficient documentation

## 2013-08-20 DIAGNOSIS — J4489 Other specified chronic obstructive pulmonary disease: Secondary | ICD-10-CM | POA: Insufficient documentation

## 2013-08-20 DIAGNOSIS — W19XXXA Unspecified fall, initial encounter: Secondary | ICD-10-CM

## 2013-08-20 DIAGNOSIS — G478 Other sleep disorders: Secondary | ICD-10-CM | POA: Insufficient documentation

## 2013-08-20 DIAGNOSIS — R413 Other amnesia: Secondary | ICD-10-CM | POA: Insufficient documentation

## 2013-08-20 DIAGNOSIS — Y9289 Other specified places as the place of occurrence of the external cause: Secondary | ICD-10-CM | POA: Insufficient documentation

## 2013-08-20 DIAGNOSIS — S058X9A Other injuries of unspecified eye and orbit, initial encounter: Secondary | ICD-10-CM | POA: Insufficient documentation

## 2013-08-20 DIAGNOSIS — Z859 Personal history of malignant neoplasm, unspecified: Secondary | ICD-10-CM | POA: Insufficient documentation

## 2013-08-20 DIAGNOSIS — Z79899 Other long term (current) drug therapy: Secondary | ICD-10-CM | POA: Insufficient documentation

## 2013-08-20 DIAGNOSIS — Z87891 Personal history of nicotine dependence: Secondary | ICD-10-CM | POA: Insufficient documentation

## 2013-08-20 DIAGNOSIS — Z8742 Personal history of other diseases of the female genital tract: Secondary | ICD-10-CM | POA: Insufficient documentation

## 2013-08-20 DIAGNOSIS — W1809XA Striking against other object with subsequent fall, initial encounter: Secondary | ICD-10-CM | POA: Insufficient documentation

## 2013-08-20 DIAGNOSIS — W06XXXA Fall from bed, initial encounter: Secondary | ICD-10-CM | POA: Insufficient documentation

## 2013-08-20 DIAGNOSIS — M25569 Pain in unspecified knee: Secondary | ICD-10-CM | POA: Insufficient documentation

## 2013-08-20 DIAGNOSIS — Z8659 Personal history of other mental and behavioral disorders: Secondary | ICD-10-CM | POA: Insufficient documentation

## 2013-08-20 DIAGNOSIS — Z7982 Long term (current) use of aspirin: Secondary | ICD-10-CM | POA: Insufficient documentation

## 2013-08-20 DIAGNOSIS — Z880 Allergy status to penicillin: Secondary | ICD-10-CM | POA: Insufficient documentation

## 2013-08-20 DIAGNOSIS — Z8669 Personal history of other diseases of the nervous system and sense organs: Secondary | ICD-10-CM | POA: Insufficient documentation

## 2013-08-20 DIAGNOSIS — IMO0002 Reserved for concepts with insufficient information to code with codable children: Secondary | ICD-10-CM

## 2013-08-20 DIAGNOSIS — Y9389 Activity, other specified: Secondary | ICD-10-CM | POA: Insufficient documentation

## 2013-08-20 HISTORY — DX: Sleepwalking (somnambulism): F51.3

## 2013-08-20 MED ORDER — TETANUS-DIPHTH-ACELL PERTUSSIS 5-2.5-18.5 LF-MCG/0.5 IM SUSP
0.5000 mL | Freq: Once | INTRAMUSCULAR | Status: AC
  Administered 2013-08-20: 0.5 mL via INTRAMUSCULAR
  Filled 2013-08-20: qty 0.5

## 2013-08-20 NOTE — ED Notes (Signed)
She is awake, cheerful and in no distress.  She is visiting with her daughter.  Dr. Dierdre Highman examines her at this time with the P.A.

## 2013-08-20 NOTE — ED Notes (Signed)
EMS called to [facility].  Found patient sitting on couch with cloth on eye with bleeding controlled post fall. EMS states that patient has 2 lacerations above the left eye.

## 2013-08-20 NOTE — ED Provider Notes (Signed)
CSN: [id number]     Arrival date & time 08/20/13  4098 History   First MD Initiated Contact with Patient 08/20/13 0602     Chief Complaint  Patient presents with  . Fall    denies LOC   (Consider location/radiation/quality/duration/timing/severity/associated sxs/prior Treatment) The history is provided by the patient. No language interpreter was used.  Rebecca Cross is a 72 y/o F with PMHx of vaginal fistula, anxiety, asthma, depression, COPD, skin cancer, seizures, diverticulitis presenting to the ED with a fall that occurred this morning. As per patient, she reported that she was sleeping and dreaming, reported that as she turned over in her bed she fell off the bed and hit her left eye onto the bedside table corner. Patient reported that as she fell she landed on her right knee. Reported that when she got up she noticed bleeding to her left eye and applied some compressions to the site. Patient reported that she has been experiencing soreness to the right knee that is constant. Stated that she has discomfort in her right great toe - reported that when she wiggles her toe she has discomfort. Denied LOC, blurred vision, sudden loss of vision, spots in vision, eye pain, neck pain, headache, dizziness, weakness, nausea, vomiting, numbness, tingling, difficulty breathing, chest pain, shortness of breath, fainting, syncope.  PCP Dr. Aniceto Boss  Past Medical History  Diagnosis Date  . Diverticulitis of sigmoid colon     known diverticulitis of sigmoid  . Vaginal fistula     between vagina and colon  . Asthma   . Anxiety   . Depression   . Allergy   . COPD (chronic obstructive pulmonary disease)   . Cancer   . Seizures   . Sleep walking    Past Surgical History  Procedure Laterality Date  . Ankle surgery      left- pin put in  . Wisdom tooth extraction    . Tubal ligation    . Nose surgery    . Cataract extraction    . Skin cancer excision      x3, face and back  . Breast biopsy  1960      on lump, left  . Abdominal hysterectomy  1982    vaginal   Family History  Problem Relation Age of Onset  . Cancer Mother     breast  . COPD Mother   . Cancer Father     skin/prostate ca  . Cancer Sister     [place] cell  . Cancer Paternal Grandmother   . Cancer Paternal Grandfather    History  Substance Use Topics  . Smoking status: Former Smoker    Quit date: 06/29/1963  . Smokeless tobacco: Not on file  . Alcohol Use: No   OB History   Grav Para Term Preterm Abortions TAB SAB Ect Mult Living                 Review of Systems  HENT: Negative for congestion, neck pain and neck stiffness.   Eyes: Positive for pain. Negative for redness and visual disturbance.  Respiratory: Negative for chest tightness and shortness of breath.   Cardiovascular: Negative for chest pain.  Musculoskeletal: Positive for arthralgias (right knee and great toe pain ).  Skin: Positive for wound.  Neurological: Negative for dizziness, syncope, weakness, numbness and headaches.  All other systems reviewed and are negative.    Allergies  Amoxicillin; Hydrocodone; Oxycodone; Sulfa antibiotics; and Erythromycin  Home Medications   Current  Outpatient Rx  Name  Route  Sig  Dispense  Refill  . aspirin 81 MG tablet   Oral   Take 81 mg by mouth daily.           . fish oil-omega-3 fatty acids 1000 MG capsule   Oral   Take 1 g by mouth daily.           BP 145/54  Pulse 53  Temp(Src) 98.3 F (36.8 C) (Oral)  Resp 18  Ht 5' (1.524 m)  SpO2 95% Physical Exam  Nursing note and vitals reviewed. Constitutional: She is oriented to person, place, and time. She appears well-developed and well-nourished. No distress.  HENT:  Head: Normocephalic.  Mouth/Throat: Oropharynx is clear and moist. No oropharyngeal exudate.  Eyes: Conjunctivae and EOM are normal. Pupils are equal, round, and reactive to light. Right eye exhibits no discharge. Left eye exhibits no discharge. Right conjunctiva is  not injected. Left conjunctiva is not injected. Right eye exhibits no nystagmus. Left eye exhibits no nystagmus.    Full EOMs intact - negative signs of entrapment.  Approximately 1 cm laceration to the left eyelid, linear, to the lateral aspect of the left orbit, just towards the end of the eyebrow line. Approximately 1.5 cm laceration to the left eyelid, inverted T design, to the medial canthus. Bleeding controlled. Negative subcutaneous tissue protrusion noted.   Neck: Normal range of motion. Neck supple.  Negative neck stiffness Negative nuchal rigidity Negative lymphadenopathy  Cardiovascular: Normal rate and regular rhythm.  Exam reveals no friction rub.   No murmur heard. Pulses:      Radial pulses are 2+ on the right side, and 2+ on the left side.       Dorsalis pedis pulses are 2+ on the right side, and 2+ on the left side.  Pulmonary/Chest: Effort normal and breath sounds normal. No respiratory distress. She has no wheezes. She has no rales.  Musculoskeletal: Normal range of motion. She exhibits tenderness.       Legs: Full ROM to the upper and lower extremities.   Right knee swelling and ecchymosis. Discomfort upon palpation to the anterior aspect of the right knee. Negative anterior-posterior draw sign. Stable right knee joint. Full ROM.   Ecchymosis noted to the base of the right great toe. Pain upon palpation. Full flexion and extension noted.   Lymphadenopathy:    She has no cervical adenopathy.  Neurological: She is alert and oriented to person, place, and time. No cranial nerve deficit or sensory deficit. She exhibits normal muscle tone. Coordination normal. GCS eye subscore is 4. GCS verbal subscore is 5. GCS motor subscore is 6.  Cranial nerves III-XII grossly intact Strength 5+/5+ to upper and lower extremities bilaterally with resistance applied, equal distribution identified Alert and oriented Responds to questions appropriately Follows commands  Skin: Skin is  warm and dry. No rash noted. She is not diaphoretic. No erythema.  Please see eye exam  Psychiatric: She has a normal mood and affect. Her behavior is normal. Thought content normal.    ED Course  Procedures (including critical care time)  7:40 AM Patient seen and assessed by Dr. Dierdre Highman. Dr. Dierdre Highman recommended sutures to be placed on the eyelid to close up the lacerations.    Date: 08/20/2013  Rate: 48  Rhythm: sinus bradycardia  QRS Axis: normal  Intervals: normal  ST/T Wave abnormalities: normal  Conduction Disutrbances:none  Narrative Interpretation:   Old EKG Reviewed: unchanged Patient was seen in sinus bradycardia  when patient was seen on 01/12/2011.  EKG analyzed reviewed by this provider and attending physician  LACERATION REPAIR Performed by: Raymon Mutton Authorized by: Raymon Mutton Consent: Verbal consent obtained. Risks and benefits: risks, benefits and alternatives were discussed Consent given by: patient Patient identity confirmed: provided demographic data Prepped and Draped in normal sterile fashion Wound explored Laceration Location: Approximately 1 cm laceration to the lateral aspect of the left orbit, near end of eyebrow line. Approximately 1.5 cm laceration to the medial canthus of the left eyelid Laceration Length: 1 cm and 1.5 cm No Foreign Bodies seen or palpated Anesthesia: local infiltration Local anesthetic: lidocaine 1% without epinephrine Anesthetic total: 5 ml Irrigation method: syringe Amount of cleaning: Extensive Skin closure: Approximate  Number of sutures: 4  Technique: Single interrupted, 6-0 Ethilon  Patient tolerance: Patient tolerated the procedure well with no immediate complications. Good hemostasis.    Labs Review Labs Reviewed - No data to display Imaging Review Ct Head Wo Contrast  08/20/2013   CLINICAL DATA:  Fall.  EXAM: CT HEAD WITHOUT CONTRAST  CT CERVICAL SPINE WITHOUT CONTRAST  TECHNIQUE: Multidetector CT imaging of  the head and cervical spine was performed following the standard protocol without intravenous contrast. Multiplanar CT image reconstructions of the cervical spine were also generated.  COMPARISON:  CT 04/22/2009.  FINDINGS: CT HEAD FINDINGS  Mild cerebral atrophy. No acute intracranial abnormality. Specifically, no hemorrhage, hydrocephalus, mass lesion, acute infarction, or significant intracranial injury. No acute calvarial abnormality. Mucous retention cyst and mucosal thickening within the left maxillary sinus. No air-fluid levels. Mastoids are clear. Orbital soft tissues are unremarkable with mild soft tissue swelling over the left orbit.  CT CERVICAL SPINE FINDINGS  Mild degenerative disc and moderate degenerative facet disease. Normal alignment. Prevertebral soft tissues are normal. No fracture. No epidural or paraspinal hematoma.  IMPRESSION: CT HEAD IMPRESSION  No acute intracranial abnormality.  CT CERVICAL SPINE IMPRESSION  No acute findings.   Electronically Signed   By: Charlett Nose M.D.   On: 08/20/2013 06:26   Ct Cervical Spine Wo Contrast  08/20/2013   CLINICAL DATA:  Fall.  EXAM: CT HEAD WITHOUT CONTRAST  CT CERVICAL SPINE WITHOUT CONTRAST  TECHNIQUE: Multidetector CT imaging of the head and cervical spine was performed following the standard protocol without intravenous contrast. Multiplanar CT image reconstructions of the cervical spine were also generated.  COMPARISON:  CT 04/22/2009.  FINDINGS: CT HEAD FINDINGS  Mild cerebral atrophy. No acute intracranial abnormality. Specifically, no hemorrhage, hydrocephalus, mass lesion, acute infarction, or significant intracranial injury. No acute calvarial abnormality. Mucous retention cyst and mucosal thickening within the left maxillary sinus. No air-fluid levels. Mastoids are clear. Orbital soft tissues are unremarkable with mild soft tissue swelling over the left orbit.  CT CERVICAL SPINE FINDINGS  Mild degenerative disc and moderate degenerative  facet disease. Normal alignment. Prevertebral soft tissues are normal. No fracture. No epidural or paraspinal hematoma.  IMPRESSION: CT HEAD IMPRESSION  No acute intracranial abnormality.  CT CERVICAL SPINE IMPRESSION  No acute findings.   Electronically Signed   By: Charlett Nose M.D.   On: 08/20/2013 06:26   Dg Knee Complete 4 Views Right  08/20/2013   CLINICAL DATA:  Fall, bruising to anterior right knee  EXAM: RIGHT KNEE - COMPLETE 4+ VIEW  COMPARISON:  None.  FINDINGS: No evidence of fracture or dislocation.  The joint spaces are preserved.  No suprapatellar knee joint effusion.  Mild prepatellar soft tissue swelling.  IMPRESSION: No fracture or  dislocation is seen.   Electronically Signed   By: Charline Bills M.D.   On: 08/20/2013 07:31   Dg Foot Complete Right  08/20/2013   CLINICAL DATA:  Fall, right great toe pain  EXAM: RIGHT FOOT COMPLETE - 3+ VIEW  COMPARISON:  None.  FINDINGS: No fracture or dislocation is seen.  The joint spaces are essentially preserved.  Small plantar and posterior calcaneal enthesophytes.  The visualized soft tissues are unremarkable.  IMPRESSION: No fracture or dislocation is seen.   Electronically Signed   By: Charline Bills M.D.   On: 08/20/2013 07:33    MDM   1. Fall, initial encounter   2. Laceration   3. Memory disturbance     Patient presenting to the ED with fall that occurred this morning, patient reported that as she turned over in bed she fell off of her bed and hit her left eye onto corner of her bedside night table. Patient denied eye pain or visual distortions, but reported that her left eye was bleeding, reported that she applied compressions. Patient reported that she has been experiencing right knee and great toe pain.  Alert and oriented. Interactive. Follows commands. Responds to questions appropriately. Swelling and ecchymosis noted to the left upper and lower eyelids, small lacerations approximately 1 cm in length with controlled bleeding  noted to the upper eyelid of the left eye. Negative signs of entrapment, EOMs intact. Ecchymosis and pain upon palpation to the anterior aspect of the right knee. Full ROM to upper and lower extremities bilaterally. Negative pain upon palpation to the face and scalp. Negative neck stiffness, negative nuchal rigidity. Negative pain upon palpation to the back - negative skin changes or lesions noted - negative abnormalities.  CT head negative findings. CT cervical spine negative findings. Plain films of right knee and foot negative for fractures of subluxation.  Approximately 1 cm laceration, linear, to the lateral aspect of the left orbit, just towards the end of the eyebrow line. Approximately 1.5 cm laceration, inverted T design, to the medial canthus. Bleeding controlled. Wound thoroughly cleaned, 2 sutures using 6-0 Ethilon applied to each laceration. Bacitracin applied and wound covered. Patient tolerated procedure well. Patient stable, afebrile. Wound thoroughly cleaned and closed, 4 sutures placed - patient tolerated procedure well - good hemostasis. Discussed with patient wound care. Discussed with patient that she needs to get the sutures out within 4 days. Discussed with patient that she needs to follow-up with her primary care provider to be re-assessed. Discussed with patient to rest and stay hydrated. Discussed with patient to apply ice to the right knee to aid in swelling to go down. Discussed with patient to avoid any she noticed activity. Educated patient on signs of concussion-discussed with patient to closely monitor symptoms and if symptoms are to develop to report back to emergency department. Discussed with patient to continue to monitor symptoms closely and if symptoms are to worsen or change report back to emergency department-strict return instructions given. Patient agreed to plan of care, understood, all questions answered to    [facility], PA-C 08/21/13 2028  Raymon Mutton, PA-C 08/21/13 2033

## 2013-08-21 NOTE — ED Provider Notes (Signed)
Medical screening examination/treatment/procedure(s) were conducted as a shared visit with non-physician practitioner(s) and myself.  I personally evaluated the patient during the encounter.     Fall with eyelid lacs and ext contusions. No LOC, on exam has 2 small lacerations that are not thru and thru and do not involve the lid margins.  Wounds repaired by MLP. Imaging reviewed   Ct Head Wo Contrast  08/20/2013   CLINICAL DATA:  Fall.  EXAM: CT HEAD WITHOUT CONTRAST  CT CERVICAL SPINE WITHOUT CONTRAST  TECHNIQUE: Multidetector CT imaging of the head and cervical spine was performed following the standard protocol without intravenous contrast. Multiplanar CT image reconstructions of the cervical spine were also generated.  COMPARISON:  CT 04/22/2009.  FINDINGS: CT HEAD FINDINGS  Mild cerebral atrophy. No acute intracranial abnormality. Specifically, no hemorrhage, hydrocephalus, mass lesion, acute infarction, or significant intracranial injury. No acute calvarial abnormality. Mucous retention cyst and mucosal thickening within the left maxillary sinus. No air-fluid levels. Mastoids are clear. Orbital soft tissues are unremarkable with mild soft tissue swelling over the left orbit.  CT CERVICAL SPINE FINDINGS  Mild degenerative disc and moderate degenerative facet disease. Normal alignment. Prevertebral soft tissues are normal. No fracture. No epidural or paraspinal hematoma.  IMPRESSION: CT HEAD IMPRESSION  No acute intracranial abnormality.  CT CERVICAL SPINE IMPRESSION  No acute findings.   Electronically Signed   By: Charlett Nose M.D.   On: 08/20/2013 06:26   Ct Cervical Spine Wo Contrast  08/20/2013   CLINICAL DATA:  Fall.  EXAM: CT HEAD WITHOUT CONTRAST  CT CERVICAL SPINE WITHOUT CONTRAST  TECHNIQUE: Multidetector CT imaging of the head and cervical spine was performed following the standard protocol without intravenous contrast. Multiplanar CT image reconstructions of the cervical spine were also  generated.  COMPARISON:  CT 04/22/2009.  FINDINGS: CT HEAD FINDINGS  Mild cerebral atrophy. No acute intracranial abnormality. Specifically, no hemorrhage, hydrocephalus, mass lesion, acute infarction, or significant intracranial injury. No acute calvarial abnormality. Mucous retention cyst and mucosal thickening within the left maxillary sinus. No air-fluid levels. Mastoids are clear. Orbital soft tissues are unremarkable with mild soft tissue swelling over the left orbit.  CT CERVICAL SPINE FINDINGS  Mild degenerative disc and moderate degenerative facet disease. Normal alignment. Prevertebral soft tissues are normal. No fracture. No epidural or paraspinal hematoma.  IMPRESSION: CT HEAD IMPRESSION  No acute intracranial abnormality.  CT CERVICAL SPINE IMPRESSION  No acute findings.   Electronically Signed   By: Charlett Nose M.D.   On: 08/20/2013 06:26   Dg Knee Complete 4 Views Right  08/20/2013   CLINICAL DATA:  Fall, bruising to anterior right knee  EXAM: RIGHT KNEE - COMPLETE 4+ VIEW  COMPARISON:  None.  FINDINGS: No evidence of fracture or dislocation.  The joint spaces are preserved.  No suprapatellar knee joint effusion.  Mild prepatellar soft tissue swelling.  IMPRESSION: No fracture or dislocation is seen.   Electronically Signed   By: Charline Bills M.D.   On: 08/20/2013 07:31   Dg Foot Complete Right  08/20/2013   CLINICAL DATA:  Fall, right great toe pain  EXAM: RIGHT FOOT COMPLETE - 3+ VIEW  COMPARISON:  None.  FINDINGS: No fracture or dislocation is seen.  The joint spaces are essentially preserved.  Small plantar and posterior calcaneal enthesophytes.  The visualized soft tissues are unremarkable.  IMPRESSION: No fracture or dislocation is seen.   Electronically Signed   By: Charline Bills M.D.   On: 08/20/2013 07:33  Sunnie Nielsen, MD 08/21/13 [phone]

## 2013-08-24 ENCOUNTER — Encounter (HOSPITAL_COMMUNITY): Payer: Self-pay | Admitting: Emergency Medicine

## 2013-08-24 ENCOUNTER — Emergency Department (HOSPITAL_COMMUNITY)
Admission: EM | Admit: 2013-08-24 | Discharge: 2013-08-24 | Disposition: A | Discharge status: Home / Self Care | Payer: Medicare Other | Attending: Emergency Medicine | Admitting: Emergency Medicine

## 2013-08-24 DIAGNOSIS — Z79899 Other long term (current) drug therapy: Secondary | ICD-10-CM | POA: Insufficient documentation

## 2013-08-24 DIAGNOSIS — Z8719 Personal history of other diseases of the digestive system: Secondary | ICD-10-CM | POA: Insufficient documentation

## 2013-08-24 DIAGNOSIS — Z4802 Encounter for removal of sutures: Secondary | ICD-10-CM | POA: Insufficient documentation

## 2013-08-24 DIAGNOSIS — J4489 Other specified chronic obstructive pulmonary disease: Secondary | ICD-10-CM | POA: Insufficient documentation

## 2013-08-24 DIAGNOSIS — Z8669 Personal history of other diseases of the nervous system and sense organs: Secondary | ICD-10-CM | POA: Insufficient documentation

## 2013-08-24 DIAGNOSIS — Z87891 Personal history of nicotine dependence: Secondary | ICD-10-CM | POA: Insufficient documentation

## 2013-08-24 DIAGNOSIS — Z859 Personal history of malignant neoplasm, unspecified: Secondary | ICD-10-CM | POA: Insufficient documentation

## 2013-08-24 DIAGNOSIS — Z7982 Long term (current) use of aspirin: Secondary | ICD-10-CM | POA: Insufficient documentation

## 2013-08-24 DIAGNOSIS — Z8659 Personal history of other mental and behavioral disorders: Secondary | ICD-10-CM | POA: Insufficient documentation

## 2013-08-24 DIAGNOSIS — J449 Chronic obstructive pulmonary disease, unspecified: Secondary | ICD-10-CM | POA: Insufficient documentation

## 2013-08-24 NOTE — ED Notes (Signed)
Pt from home requesting suture removal. Pt had them placed at this facility. Pt A&O and in NAD

## 2013-08-24 NOTE — ED Provider Notes (Signed)
CSN: [id number]     Arrival date & time 08/24/13  4098 History   First MD Initiated Contact with Patient 08/24/13 1006     Chief Complaint  Patient presents with  . Suture / Staple Removal   (Consider location/radiation/quality/duration/timing/severity/associated sxs/prior Treatment) HPI Rebecca Cross is a 72 y.o. female presents emergency department requesting suture removal. Patient states she fell out of bed while having a dream 5 days ago. States she is dreaming that she was marrying [place]. She received 4 sutures to the left eyelid. States that her lacerations are healing well with no pain, drainage, dehiscence. Patient has been washing it with soap and water and applying bacitracin ointment twice a day. She has no other complaints.  Past Medical History  Diagnosis Date  . Diverticulitis of sigmoid colon     known diverticulitis of sigmoid  . Vaginal fistula     between vagina and colon  . Asthma   . Anxiety   . Depression   . Allergy   . COPD (chronic obstructive pulmonary disease)   . Cancer   . Seizures   . Sleep walking    Past Surgical History  Procedure Laterality Date  . Ankle surgery      left- pin put in  . Wisdom tooth extraction    . Tubal ligation    . Nose surgery    . Cataract extraction    . Skin cancer excision      x3, face and back  . Breast biopsy  1960    on lump, left  . Abdominal hysterectomy  1982    vaginal   Family History  Problem Relation Age of Onset  . Cancer Mother     breast  . COPD Mother   . Cancer Father     skin/prostate ca  . Cancer Sister     [place] cell  . Cancer Paternal Grandmother   . Cancer Paternal Grandfather    History  Substance Use Topics  . Smoking status: Former Smoker    Quit date: 06/29/1963  . Smokeless tobacco: Not on file  . Alcohol Use: No   OB History   Grav Para Term Preterm Abortions TAB SAB Ect Mult Living                 Review of Systems  Constitutional: Negative for fever and  chills.  HENT: Negative for neck pain and neck stiffness.   Respiratory: Negative for cough, chest tightness and shortness of breath.   Cardiovascular: Negative for chest pain, palpitations and leg swelling.  Musculoskeletal: Negative for myalgias and arthralgias.  Skin: Positive for wound. Negative for rash.  Neurological: Negative for dizziness, weakness and headaches.  All other systems reviewed and are negative.    Allergies  Amoxicillin; Hydrocodone; Oxycodone; Sulfa antibiotics; and Erythromycin  Home Medications   Current Outpatient Rx  Name  Route  Sig  Dispense  Refill  . aspirin 81 MG tablet   Oral   Take 81 mg by mouth daily.          . fish oil-omega-3 fatty acids 1000 MG capsule   Oral   Take 1 g by mouth daily.           BP 118/60  Pulse 57  Temp(Src) 98.7 F (37.1 C) (Oral)  Resp 18  SpO2 97% Physical Exam  Nursing note and vitals reviewed. Constitutional: She appears well-developed and well-nourished. No distress.  HENT:  Head: Normocephalic.  Eyes: Conjunctivae are  normal.  Neck: Neck supple.  Cardiovascular: Normal rate, regular rhythm and normal heart sounds.   Pulmonary/Chest: Effort normal and breath sounds normal. No respiratory distress. She has no wheezes. She has no rales.  Neurological: She is alert.  Skin: Skin is warm and dry.  Two healed 1cm lacerations to the left eye lid with sutures intact. Healing well with no signs of infection, no surrounding erythema, no drainage or dehiscence.   Psychiatric: She has a normal mood and affect. Her behavior is normal.    ED Course  Procedures (including critical care time) Labs Review Labs Reviewed - No data to display Imaging Review No results found.  MDM   1. Visit for suture removal     Patient here for suture removal. Her lacerations are healing well with no signs of infection or dehiscence. Sutures removed. She is to continue careful wound care and bacitracin twice a day. Followup  with her doctor as needed  Filed Vitals:   08/24/13 0958  BP: 118/60  Pulse: 57  Temp: 98.7 F (37.1 C)  Resp: 18       Nettye Flegal A Hilde Churchman, PA-C 08/24/13 1951

## 2013-08-29 ENCOUNTER — Ambulatory Visit: Payer: Medicare Other | Admitting: Family Medicine

## 2013-09-04 ENCOUNTER — Ambulatory Visit: Payer: Medicare Other | Admitting: Family Medicine

## 2013-09-15 ENCOUNTER — Telehealth: Payer: Self-pay

## 2013-09-15 NOTE — Telephone Encounter (Signed)
Audria Nine - Pt's sister, Juleen Starr, called in regards to her appointment this Thursday.  She would like to talk to the doctor about the patient's dementia.  She is listed on the patient's HIPPA and she has power of attorney.  Please call asap.  She lives in [place] and will be traveling Wednesday to come stay with [place].  Call home at [phone] or cell at [phone]

## 2013-09-17 NOTE — Telephone Encounter (Signed)
Routing message to Dr. Audria Nine.

## 2013-09-17 NOTE — Telephone Encounter (Signed)
I left a message for Ms. Rebecca Cross; note says she is traveling today so I will talk with her tomorrow at the time of pt's office visit.

## 2013-09-18 ENCOUNTER — Encounter: Payer: Self-pay | Admitting: Family Medicine

## 2013-09-18 ENCOUNTER — Ambulatory Visit (INDEPENDENT_AMBULATORY_CARE_PROVIDER_SITE_OTHER): Payer: Medicare Other | Admitting: Family Medicine

## 2013-09-18 VITALS — BP 120/80 | HR 68 | Temp 98.0°F | Resp 16 | Ht 64.5 in | Wt 163.0 lb

## 2013-09-18 DIAGNOSIS — Z23 Encounter for immunization: Secondary | ICD-10-CM

## 2013-09-18 DIAGNOSIS — R300 Dysuria: Secondary | ICD-10-CM

## 2013-09-18 DIAGNOSIS — R413 Other amnesia: Secondary | ICD-10-CM

## 2013-09-18 DIAGNOSIS — Z8659 Personal history of other mental and behavioral disorders: Secondary | ICD-10-CM

## 2013-09-18 MED ORDER — ZOSTER VACCINE LIVE 19400 UNT/0.65ML ~~LOC~~ SOLR: 0.6500 mL | Freq: Once | SUBCUTANEOUS | Status: DC
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
## 2013-09-18 NOTE — Patient Instructions (Addendum)
I am referring you back to Dr. Anne Hahn for follow-up and re-evaluation of memory problems. You were supposed to go back to him 6 months after last visit. You will need to let him know that you could not afford the medication he prescribed (Namenda). He may be able to prescribe an alternative.   I have recommended that you stop driving and you have stated that you will stop driving if Dr. Anne Hahn recommends this also. You have discussed other transportation options with your sister; you will not be stranded at your residence! Our main concern is your safety and the safety of others on the road.  We have collected a urine specimen to send for culture to see if you have an infection in your urinary tract.

## 2013-09-19 LAB — URINE CULTURE
Colony Count: NO GROWTH
Organism ID, Bacteria: NO GROWTH

## 2013-09-25 ENCOUNTER — Encounter: Payer: Self-pay | Admitting: *Deleted

## 2013-10-02 ENCOUNTER — Ambulatory Visit (INDEPENDENT_AMBULATORY_CARE_PROVIDER_SITE_OTHER): Payer: Medicare Other | Admitting: Neurology

## 2013-10-02 ENCOUNTER — Encounter: Payer: Self-pay | Admitting: Neurology

## 2013-10-02 VITALS — BP 135/58 | HR 57 | Ht 64.0 in | Wt 165.0 lb

## 2013-10-02 DIAGNOSIS — R413 Other amnesia: Secondary | ICD-10-CM

## 2013-10-02 MED ORDER — MEMANTINE HCL ER 28 MG PO CP24: 28.0000 mg | ORAL_CAPSULE | Freq: Every day | ORAL | Status: DC

## 2013-10-02 NOTE — Progress Notes (Signed)
Reason for visit: Memory disorder  Rebecca Cross is an 72 y.o. female  History of present illness:  Rebecca Cross is a 72 year old right-handed white female with a history of a progressive memory disorder. The patient was last seen through this office in September 2013 with a one-year history at that time of memory issues. The patient has since moved to an assisted living situation with [facility]. The patient is no longer operating a motor vehicle, and her sister is managing her financial affairs. The patient has had some weight loss. The patient reports some troubles with intermittent diarrhea. The patient was placed on Namenda when she was seen last, but she never took it secondary to financial issues. 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 The patient has fallen on occasion because of this. The patient returns to this office for further evaluation.   Past Medical History  Diagnosis Date  . Diverticulitis of sigmoid colon     known diverticulitis of sigmoid  . Vaginal fistula     between vagina and colon  . Asthma   . Anxiety   . Depression   . Allergy   . COPD (chronic obstructive pulmonary disease)   . Seizures   . Sleep walking   . REM sleep behavior disorder   . Gallstones   . Vitamin D deficiency   . Toe fracture, left     left 4th toe  . Migraine   . Cancer     basal cell carcinoma  . Clavicle fracture     right    Past Surgical History  Procedure Laterality Date  . Ankle surgery      left- pin put in  . Wisdom tooth extraction    . Tubal ligation    . Nose surgery    . Cataract extraction    . Skin cancer excision      x3, face and back  . Breast biopsy  1960    on lump, left  . Abdominal hysterectomy  1982    vaginal    Family History  Problem Relation Age of Onset  . Cancer Mother     breast  . COPD Mother   . Cancer Father     skin/prostate ca  . Cancer Sister     [place]  cell  . Cancer Paternal Grandmother   . Cancer Paternal Grandfather     Social history:  reports that she quit smoking about 50 years ago. She has never used smokeless tobacco. She reports that she does not drink alcohol or use illicit drugs.    Allergies  Allergen Reactions  . Amoxicillin Nausea And Vomiting  . Hydrocodone Nausea Only  . Oxycodone Nausea Only  . Sulfa Antibiotics Itching  . Erythromycin Rash    Medications:  Current Outpatient Prescriptions on File Prior to Visit  Medication Sig Dispense Refill  . aspirin 81 MG tablet Take 81 mg by mouth daily.       . fish oil-omega-3 fatty acids 1000 MG capsule Take 1 g by mouth daily.       Marland Kitchen zoster vaccine live, PF, (ZOSTAVAX) 14782 UNT/0.65ML injection Inject 19,400 Units into the skin once.  1 each  0   No current facility-administered medications on file prior to visit.    ROS:  Out of a complete 14 system review of symptoms, the patient complains only of the following symptoms, and all  other reviewed systems are negative.  Fevers and chills, weight loss  Chest pain Hearing deficit, ringing in the ears, dizziness Moles Blurred vision Blood in stool, incontinence, diarrhea, constipation Urination problems, urinary incontinence, blood in the urine Memory loss, confusion, slurred speech Depression, anxiety  Blood pressure 135/58, pulse 57, height 5\' 4"  (1.626 m), weight 165 lb (74.844 kg).  Physical Exam  General: The patient is alert and cooperative at the time of the examination.  Skin: No significant peripheral edema is noted.   Neurologic Exam  Mental status: The Mini-Mental status examination done today shows a total score of 25/30.   Cranial nerves: Facial symmetry is present. Speech is normal, no aphasia or dysarthria is noted. Extraocular movements are full. Visual fields are full.  Motor: The patient has good strength in all 4 extremities.  Sensory examination: Soft touch sensation on the face,  arms, and legs is symmetric.   Coordination: The patient has good finger-nose-finger and heel-to-shin bilaterally.  Gait and station: The patient has a normal gait. Tandem gait is normal. Romberg is negative. No drift is seen.  Reflexes: Deep tendon reflexes are symmetric.   Assessment/Plan:  One. Progressive memory disturbance  2. REM sleep behavior disorder  The patient will be placed back on Namenda. The patient indicates that she has issues with diarrhea, and she does not want to try Aricept. The patient was given samples of Namenda, and a prescription. The patient will followup in about 8 months. The memory issues will be followed over time. The use of an Exelon patch in the future may be considered, as this may have fewer GI side effects.  Marlan Palau MD 10/02/2013 7:02 PM  [facility] [address] [place], [place] [address]  Phone [phone] Fax [phone]

## 2013-10-02 NOTE — Patient Instructions (Signed)
Dementia Dementia is a general term for problems with brain function. A person with dementia has memory loss and a hard time with at least one other brain function such as thinking, speaking, or problem solving. Dementia can affect social functioning, how you do your job, your mood, or your personality. The changes may be hidden for a long time. The earliest forms of this disease are usually not detected by family or friends. Dementia can be:  Irreversible.  Potentially reversible.  Partially reversible.  Progressive. This means it can get worse over time. CAUSES  Irreversible dementia causes may include:  Degeneration of brain cells (Alzheimer's disease or lewy body dementia).  Multiple small strokes (vascular dementia).  Infection (chronic meningitis or Creutzfelt-Jakob disease).  Frontotemporal dementia. This affects younger people, age 40 to 70, compared to those who have Alzheimer's disease.  Dementia associated with other disorders like Parkinson's disease, Huntington's disease, or HIV-associated dementia. Potentially or partially reversible dementia causes may include:  Medicines.  Metabolic causes such as excessive alcohol intake, vitamin B12 deficiency, or thyroid disease.  Masses or pressure in the brain such as a tumor, blood clot, or hydrocephalus. SYMPTOMS  Symptoms are often hard to detect. Family members or coworkers may not notice them early in the disease process. Different people with dementia may have different symptoms. Symptoms can include:  A hard time with memory, especially recent memory. Long-term memory may not be impaired.  Asking the same question multiple times or forgetting something someone just said.  A hard time speaking your thoughts or finding certain words.  A hard time solving problems or performing familiar tasks (such as how to use a telephone).  Sudden changes in mood.  Changes in personality, especially increasing moodiness or  mistrust.  Depression.  A hard time understanding complex ideas that were never a problem in the past. DIAGNOSIS  There are no specific tests for dementia.   Your caregiver may recommend a thorough evaluation. This is because some forms of dementia can be reversible. The evaluation will likely include a physical exam and getting a detailed history from you and a family member. The history often gives the best clues and suggestions for a diagnosis.  Memory testing may be done. A detailed brain function evaluation called neuropsychologic testing may be helpful.  Lab tests and brain imaging (such as a CT scan or MRI scan) are sometimes important.  Sometimes observation and re-evaluation over time is very helpful. TREATMENT  Treatment depends on the cause.   If the problem is a vitamin deficiency, it may be helped or cured with supplements.  For dementias such as Alzheimer's disease, medicines are available to stabilize or slow the course of the disease. There are no cures for this type of dementia.  Your caregiver can help direct you to groups, organizations, and other caregivers to help with decisions in the care of you or your loved one. HOME CARE INSTRUCTIONS The care of individuals with dementia is varied and dependent upon the progression of the dementia. The following suggestions are intended for the person living with, or caring for, the person with dementia.  Create a safe environment.  Remove the locks on bathroom doors to prevent the person from accidentally locking himself or herself in.  Use childproof latches on kitchen cabinets and any place where cleaning supplies, chemicals, or alcohol are kept.  Use childproof covers in unused electrical outlets.  Install childproof devices to keep doors and windows secured.  Remove stove knobs or install safety   knobs and an automatic shut-off on the stove.  Lower the temperature on water heaters.  Label medicines and keep them  locked up.  Secure knives, lighters, matches, power tools, and guns, and keep these items out of reach.  Keep the house free from clutter. Remove rugs or anything that might contribute to a fall.  Remove objects that might break and hurt the person.  Make sure lighting is good, both inside and outside.  Install grab rails as needed.  Use a monitoring device to alert you to falls or other needs for help.  Reduce confusion.  Keep familiar objects and people around.  Use night lights or dim lights at night.  Label items or areas.  Use reminders, notes, or directions for daily activities or tasks.  Keep a simple, consistent routine for waking, meals, bathing, dressing, and bedtime.  Create a calm, quiet environment.  Place large clocks and calendars prominently.  Display emergency numbers and home address near all telephones.  Use cues to establish different times of the day. An example is to open curtains to let the natural light in during the day.   Use effective communication.  Choose simple words and short sentences.  Use a gentle, calm tone of voice.  Be careful not to interrupt.  If the person is struggling to find a word or communicate a thought, try to provide the word or thought.  Ask one question at a time. Allow the person ample time to answer questions. Repeat the question again if the person does not respond.  Reduce nighttime restlessness.  Provide a comfortable bed.  Have a consistent nighttime routine.  Ensure a regular walking or physical activity schedule. Involve the person in daily activities as much as possible.  Limit napping during the day.  Limit caffeine.  Attend social events that stimulate rather than overwhelm the senses.  Encourage good nutrition and hydration.  Reduce distractions during meal times and snacks.  Avoid foods that are too hot or too cold.  Monitor chewing and swallowing ability.  Continue with routine vision,  hearing, dental, and medical screenings.  Only give over-the-counter or prescription medicines as directed by the caregiver.  Monitor driving abilities. Do not allow the person to drive when safe driving is no longer possible.  Register with an identification program which could provide location assistance in the event of a missing person situation. SEEK MEDICAL CARE IF:   New behavioral problems start such as moodiness, aggressiveness, or seeing things that are not there (hallucinations).  Any new problem with brain function happens. This includes problems with balance, speech, or falling a lot.  Problems with swallowing develop.  Any symptoms of other illness happen. Small changes or worsening in any aspect of brain function can be a sign that the illness is getting worse. It can also be a sign of another medical illness such as infection. Seeing a caregiver right away is important. SEEK IMMEDIATE MEDICAL CARE IF:   A fever develops.  New or worsened confusion develops.  New or worsened sleepiness develops.  Staying awake becomes hard to do. Document Released: 05/02/2001 Document Revised: 01/29/2012 Document Reviewed: 04/03/2011 Exit[employer] Patient Information 2014 [employer].  

## 2013-11-24 ENCOUNTER — Other Ambulatory Visit: Payer: Self-pay

## 2013-11-24 DIAGNOSIS — Z1231 Encounter for screening mammogram for malignant neoplasm of breast: Secondary | ICD-10-CM

## 2013-12-16 ENCOUNTER — Ambulatory Visit
Admission: RE | Admit: 2013-12-16 | Discharge: 2013-12-16 | Disposition: A | Discharge status: Home / Self Care | Payer: Medicare Other | Source: Ambulatory Visit

## 2013-12-16 DIAGNOSIS — Z1231 Encounter for screening mammogram for malignant neoplasm of breast: Secondary | ICD-10-CM

## 2014-05-28 ENCOUNTER — Telehealth: Payer: Self-pay | Admitting: Neurology

## 2014-05-28 NOTE — Telephone Encounter (Signed)
I called the family. The patient has had some problems with increasing agitation and paranoid behavior. This unfortunately is common with a memory disturbance. The patient is on Namenda. She may need further medication in the future for these issues, possibly starting on an SSRI antidepressant medication. The patient will be seen in the office in the near future.

## 2014-05-28 NOTE — Telephone Encounter (Signed)
Patient's sister Vear Clock) requesting an appointment with Dr. Jannifer Franklin sometime in August.  Patient has become very belligerent and not taking any meds.  Please call and advise @ [phone].  Thanks

## 2014-05-28 NOTE — Telephone Encounter (Signed)
Spoke to patient's sister and she relayed that the patient now thinks people are coming in her room to give her shots, this has started over the past week.  They have found a caregiver for her because she is at an independent living facility and they asked that she received some kind of assistance.  She is not taking any medicine at all.  I have made an appointment with NP for August when family members will be able to come.  She is not sure if she needs something for the change in behavior.

## 2014-05-29 ENCOUNTER — Telehealth: Payer: Self-pay
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
## 2014-06-01 ENCOUNTER — Ambulatory Visit: Payer: Self-pay | Admitting: Adult Health

## 2014-06-01 NOTE — Telephone Encounter (Signed)
Dr Jannifer Franklin has advised patient has increased confusion, and will be seen by him in follow up, see messages from Dr Jannifer Franklin in review. If daughter is concerned, she can call adult protective services, but she should make sure her mother is not confused first. I want you to review, before I recommend this. Please advise.

## 2014-06-02 NOTE — Telephone Encounter (Signed)
pts daughter calling again, she would like to add that pateint has a uti and would like to know if patient would have to come in or can she just bring in a sample of the urine. Best#  [phone]

## 2014-06-03 ENCOUNTER — Other Ambulatory Visit: Payer: Self-pay | Admitting: Radiology

## 2014-06-03 ENCOUNTER — Telehealth: Payer: Self-pay | Admitting: Family Medicine

## 2014-06-03 DIAGNOSIS — F05 Delirium due to known physiological condition: Secondary | ICD-10-CM
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
## 2014-06-03 NOTE — Telephone Encounter (Signed)
Family member can bring in urine sample; looks like Dr. Jannifer Franklin' office is trying to address the cognitive issue and pt to be seen in his office in the near future.

## 2014-06-04 NOTE — Telephone Encounter (Signed)
Rebecca Cross has spoken to daughter about this, and I ordered the urine dip/ micro

## 2014-06-08 ENCOUNTER — Telehealth: Payer: Self-pay | Admitting: Family Medicine

## 2014-06-08 NOTE — Telephone Encounter (Signed)
Unable to leave message on VM

## 2014-06-08 NOTE — Telephone Encounter (Signed)
Spoke with patient daughter she states that her mothers care taker debra brought patients urine and left it with a white lady with brown curly hair that walks with a limp i called and spoke with lab they never received urine and that they may have thrown the urine away that day and that the care taker can come to [address] building and pick up a urine cup and hat so that we can do the test here it looks like only a future order of a urine dip was ordered by Amy L The care taker will return back to our clinic to pick up items

## 2014-06-08 NOTE — Telephone Encounter (Signed)
Rebecca Cross  Has not heard anything regarding her mothers specimen they brought in last week.  Please advise.    [phone]

## 2014-06-09 NOTE — Telephone Encounter (Signed)
Unable to leave message- phone has been disconnected.

## 2014-06-10 ENCOUNTER — Other Ambulatory Visit (INDEPENDENT_AMBULATORY_CARE_PROVIDER_SITE_OTHER): Payer: Medicare Other

## 2014-06-10 ENCOUNTER — Encounter: Payer: Self-pay | Admitting: *Deleted

## 2014-06-10 DIAGNOSIS — F05 Delirium due to known physiological condition: Secondary | ICD-10-CM

## 2014-06-10 LAB — POCT URINALYSIS DIPSTICK
BILIRUBIN UA: NEGATIVE
Glucose, UA: NEGATIVE
KETONES UA: NEGATIVE
Leukocytes, UA: NEGATIVE
Nitrite, UA: NEGATIVE
Protein, UA: NEGATIVE
Spec Grav, UA: 1.01
Urobilinogen, UA: 0.2
pH, UA: 6.5

## 2014-06-10 NOTE — Telephone Encounter (Signed)
Number is disconnected. Sent letter to pt.

## 2014-06-23 ENCOUNTER — Encounter: Payer: Self-pay | Admitting: Adult Health

## 2014-06-23 ENCOUNTER — Ambulatory Visit (INDEPENDENT_AMBULATORY_CARE_PROVIDER_SITE_OTHER): Payer: Medicare Other | Admitting: Adult Health

## 2014-06-23 VITALS — BP 113/75 | HR 65 | Ht 66.0 in | Wt 174.6 lb

## 2014-06-23 DIAGNOSIS — Z8659 Personal history of other mental and behavioral disorders: Secondary | ICD-10-CM

## 2014-06-23 DIAGNOSIS — R413 Other amnesia: Secondary | ICD-10-CM

## 2014-06-23 MED ORDER — DONEPEZIL HCL 5 MG PO TABS: 5.0000 mg | ORAL_TABLET | Freq: Every day | ORAL | Status: DC

## 2014-06-23 NOTE — Patient Instructions (Signed)

## 2014-06-23 NOTE — Progress Notes (Signed)
I have read the note, and I agree with the clinical assessment and plan.  Harlon Kutner KEITH   

## 2014-06-23 NOTE — Progress Notes (Signed)
PATIENT: Rebecca Cross DOB: January 06, 1941  REASON FOR VISIT: follow up HISTORY FROM: patient  HISTORY OF PRESENT ILLNESS: Rebecca Cross is a 73 year old female with a history of progressive memory disorder. She returns today for follow-up. She was prescribed Namenda but is no longer taking it due to cost. Patient feels that her memory is "ok." patient has given up task/activities due to her memory. Sister and caregiver state that she does not walk the dog as much as she used to. 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 Sister is currently in charge of all her finances. She currently lives at [facility]. Patient is able to complete ADLs independently according to patient and caregiver. She does have to be prompted to go eat breakfast, lunch or dinner. Caregiver states that she has had a couple of incidents where she was very abrupt at the dinner table. Caregiver states that she has mentioned suicide because she felt like a burden to people. Her caregiver took her to the church service at [employer] and after that the patient states that she did feel better. Patient denies being suicidal now. Caregiver explains that at [facility] it is independent living so some of the residents and staff do not under dementia and have complained about the patient. Therefore that has hurt the patient's feelings and lead to the discussion about suicide. Patient has also had an incident where she was out walking the dog at night and management at [facility] found her. Caregiver states at that time she was confused. Family has hired a Academic librarian to check on her to 3 times daily. Patient is not taking her medications and [facility] is not responsible for giving her medications.    REVIEW OF SYSTEMS: Full 14 system review of systems performed and notable only for:  Constitutional: N/A    Eyes: N/A Ear/Nose/Throat: N/A  Skin: N/A  Cardiovascular: N/A  Respiratory: N/A  Gastrointestinal: N/A  Genitourinary: N/A Hematology/Lymphatic: N/A  Endocrine: cold intolerance Musculoskeletal:N/A  Allergy/Immunology: N/A  Neurological: N/A Psychiatric: N/A Sleep: N/A   ALLERGIES: Allergies  Allergen Reactions  . Amoxicillin Nausea And Vomiting  . Hydrocodone Nausea Only  . Oxycodone Nausea Only  . Sulfa Antibiotics Itching  . Erythromycin Rash    HOME MEDICATIONS: Outpatient Prescriptions Prior to Visit  Medication Sig Dispense Refill  . aspirin 81 MG tablet Take 81 mg by mouth daily.       . fish oil-omega-3 fatty acids 1000 MG capsule Take 1 g by mouth daily.       . Memantine HCl ER 28 MG CP24 Take 28 mg by mouth daily.  90 capsule  3  . zoster vaccine live, PF, (ZOSTAVAX) 16606 UNT/0.65ML injection Inject 19,400 Units into the skin once.  1 each  0   No facility-administered medications prior to visit.    PAST MEDICAL HISTORY: Past Medical History  Diagnosis Date  . Diverticulitis of sigmoid colon     known diverticulitis of sigmoid  . Vaginal fistula     between vagina and colon  . Asthma   . Anxiety   . Depression   . Allergy   . COPD (chronic obstructive pulmonary disease)   . Seizures   . Sleep walking   . REM sleep behavior disorder   . Gallstones   . Vitamin D deficiency   . Toe  fracture, left     left 4th toe  . Migraine   . Cancer     basal cell carcinoma  . Clavicle fracture     right    PAST SURGICAL HISTORY: Past Surgical History  Procedure Laterality Date  . Ankle surgery      left- pin put in  . Wisdom tooth extraction    . Tubal ligation    . Nose surgery    . Cataract extraction    . Skin cancer excision      x3, face and back  . Breast biopsy  1960    on lump, left  . Abdominal hysterectomy  1982    vaginal    FAMILY HISTORY: Family History  Problem Relation Age of Onset  . Cancer Mother     breast  . COPD  Mother   . Cancer Father     skin/prostate ca  . Cancer Sister     [place] cell  . Cancer Paternal Grandmother   . Cancer Paternal Grandfather     SOCIAL HISTORY: History   Social History  . Marital Status: Divorced    Spouse Name: N/A    Number of Children: 1  . Years of Education: COLLEGE   Occupational History  .     Social History Main Topics  . Smoking status: Former Smoker    Quit date: 06/29/1963  . Smokeless tobacco: Never Used  . Alcohol Use: No  . Drug Use: No  . Sexual Activity: No   Other Topics Concern  . Not on file   Social History Narrative   Patient is right handed, resides alone      PHYSICAL EXAM  Filed Vitals:   06/23/14 1529  Height: _0  (1.676 m)  Weight: 174 lb 9.6 oz (79.198 kg)   Body mass index is 28.19 kg/(m^2).  Generalized: Well developed, in no acute distress   Neurological examination  Mentation: . Follows all commands speech and language fluent Cranial nerve II-XII:  Extraocular movements were full, visual field were full on confrontational test. Facial sensation and strength were normal. Head turning and shoulder shrug  were normal and symmetric. Motor: The motor testing reveals 5 over 5 strength of all 4 extremities. Good symmetric motor tone is noted throughout.  Sensory: Sensory testing is intact to soft touch on all 4 extremities. No evidence of extinction is noted.  Coordination: Cerebellar testing reveals some difficulty with finger-nose-finger and heel-to-shin bilaterally mainly with directions.  Gait and station: Gait is normal. Tandem gait is normal. Romberg is negative. No drift is seen.  Reflexes: Deep tendon reflexes are symmetric and normal bilaterally.     DIAGNOSTIC DATA (LABS, IMAGING, TESTING) - I reviewed patient records, labs, notes, testing and imaging myself where available.  Lab Results  Component Value Date   WBC 10.4 07/31/2013   HGB 13.0 07/31/2013   HCT 38.6 07/31/2013   MCV 87.7 07/31/2013    PLT 177 07/31/2013      Component Value Date/Time   NA 142 07/31/2013 0747   K 3.5 07/31/2013 0747   CL 108 07/31/2013 0747   CO2 27 07/31/2013 0747   GLUCOSE 83 07/31/2013 0747   BUN 15 07/31/2013 0747   CREATININE 0.80 07/31/2013 0747   CREATININE 0.80 01/18/2011 0435   CALCIUM 8.8 07/31/2013 0747   PROT 6.6 07/31/2013 0747   ALBUMIN 3.8 07/31/2013 0747   AST 17 07/31/2013 0747   ALT 12 07/31/2013 0747   ALKPHOS 71 07/31/2013  0747   BILITOT 0.5 07/31/2013 0747   GFRNONAA >60 01/18/2011 0435   GFRAA  Value: >60        The eGFR has been calculated using the MDRD equation. This calculation has not been validated in all clinical situations. eGFR's persistently <60 mL/min signify possible Chronic Kidney Disease. 01/18/2011 0435    Lab Results  Component Value Date   TSH 1.466 07/31/2013      ASSESSMENT AND PLAN 73 y.o. year old female  has a past medical history of Diverticulitis of sigmoid colon; Vaginal fistula; Asthma; Anxiety; Depression; Allergy; COPD (chronic obstructive pulmonary disease); Seizures; Sleep walking; REM sleep behavior disorder; Gallstones; Vitamin D deficiency; Toe fracture, left; Migraine; Cancer; and Clavicle fracture. here with:  1. Memory loss 2. History of depression  Patient is not currently taking anything for memory. She was taking Namenda but could not afford it. I will start the patient on Aricept 5 mg daily. I reviewed the side effects of this medication with the patient, caregiver and sister. The patient is currently living in an independent living facility, I feel that the patient would benefit from an assisted living facility. The patient has had thoughts of suicide in the past as well as wondering off the property. The patient would benefit from having 24 hour supervision or at minimum an outside source staying with the patient at [facility]. The patient currently has a small dog that she would like to keep with her. Family and caregiver feel that the dog does  offer some comfort to the patient. I agree that if they can find a facility that would allow that,  it would be beneficial for the patient. I have given the patient and family the number for [facility] which is a free service to help fine appropriate placement for patients. Patient should follow up with [place] in 6 months or sooner if needed.   Ward Givens, MSN, NP-C 06/23/2014, 3:47 PM [facility] [address], [place], [place] [address] [phone]  Note: This document was prepared with digital dictation and possible smart phrase technology. Any transcriptional errors that result from this process are unintentional.

## 2014-06-30 ENCOUNTER — Ambulatory Visit (INDEPENDENT_AMBULATORY_CARE_PROVIDER_SITE_OTHER): Payer: Medicare Other | Admitting: Family Medicine

## 2014-06-30 ENCOUNTER — Encounter: Payer: Self-pay | Admitting: Family Medicine

## 2014-06-30 VITALS — BP 99/53 | HR 65 | Temp 98.5°F | Resp 16 | Ht 64.25 in | Wt 173.0 lb

## 2014-06-30 DIAGNOSIS — R109 Unspecified abdominal pain: Secondary | ICD-10-CM

## 2014-06-30 DIAGNOSIS — R3129 Other microscopic hematuria: Secondary | ICD-10-CM

## 2014-06-30 DIAGNOSIS — R311 Benign essential microscopic hematuria: Secondary | ICD-10-CM

## 2014-06-30 NOTE — Progress Notes (Signed)
S:  This 73 y.o. 73 female is here w/ her younger sister; pt recently seen at [facility] and diagnosed w/ Alzheimer's Dementia. Pt just started Aricept within last week. Pt c/o sleeping a lot; she has few activities that occupy her time. Current living situation is independent living at [facility] w/ some private caregivers that look in on pt. The sister states the family will be trying to transition pt to SNF w/ an AD unit.  Pt here also to f/u on hematuria. She is asymptomatic. 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 Pt has bladder incontinence and wears pads.  Pt reports some pelvic cramping w/ gas and diarrhea; this seems to be related to dairy product consumption.  Pt states she is upset because daughters removed items from her home (books and computers and fax machine); this upsets pt because she was hoping to resume work in Engineer, technical sales. When asked if she did any reading in her free time, she responded that she did not read very well anymore.  Pt also request referral back to psychologist at [place]; her sister will try t find the name of that therapist; pt wants to resume therapy sessions ("Someone who I feel I can talk to who has my interests in mind"). Pt seems to be distrustful of some of her family members.  Patient Active Problem List   Diagnosis Date Noted  . History of depression 09/18/2013  . Personal history of goiter 08/02/2013  . Mass of left posterior buttock/thigh 18x12cm (probable lipoma) 04/17/2013  . Anxiety   . 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 Sigmoid diverticulitis 06/29/2011  . Fibrocystic breast disease 06/19/2011    Prior to Admission medications   Medication Sig Start Date End Date Taking? Authorizing Provider  donepezil (ARICEPT) 5 MG tablet Take 1 tablet (5 mg total) by mouth at bedtime. 06/23/14   Ward Givens, NP    Allergies  Allergen Reactions  .  Amoxicillin Nausea And Vomiting  . Hydrocodone Nausea Only  . Oxycodone Nausea Only  . Sulfa Antibiotics Itching  . Erythromycin Rash    Past Surgical History  Procedure Laterality Date  . Ankle surgery      left- pin put in  . Wisdom tooth extraction    . Tubal ligation    . Nose surgery    . Cataract extraction    . Skin cancer excision      x3, face and back  . Breast biopsy  1960    on lump, left  . Abdominal hysterectomy  1982    vaginal    Family History  Problem Relation Age of Onset  . Cancer Mother     breast  . COPD Mother   . Cancer Father     skin/prostate ca  . Cancer Sister     [place] cell  . Cancer Paternal Grandmother   . Cancer Paternal Grandfather     ROS: As per HPI. Otherwise unremarkable.  O: Filed Vitals:   06/30/14 1454  BP: 99/53  Pulse: 65  Temp: 98.5 F (36.9 C)  Resp: 16   GEN: In NAD: WN,WD. HENT: [place]/AT. EOMI w/ clear conj/sclerae. Ext ears/nose/oroph normal. COR: RRR. LUNGS: Unlabored resp. ABD: Soft and NT. SKIN: W&D; intact w/o diaphoresis, rashes, ecchymoses or lesions. NEURO: Alert and oriented to person and place. Speech is halting; difficulty w/ word-finding. Cognitive impairment is evident; pt inattentive at times and does not respond to some  questions in a coherent manner. Seems to be rambling in conversation.  A/P: Abdominal cramping- Advised Lactose- free diet.  Benign microscopic hematuria- Suspect related to colovaginal fistula and incontinence requiring pads that may be an irritant to perineum.  Alzheimer's Dementia- Pt to follow-up at [facility] in Feb 2016. Any forms that need to be completed prior to move to SNF will be sent to me.

## 2014-06-30 NOTE — Patient Instructions (Signed)
Lactose Intolerance, Adult Lactose intolerance is when the body is not able to digest lactose, a sugar found in milk and milk products. Lactose intolerance is caused by your body not producing enough of the enzyme lactase. When there is not enough lactase to digest the amount of lactose consumed, discomfort may be felt. Lactose intolerance is not a milk allergy. For most people, lactase deficiency is a condition that develops naturally over time. After about the age of 2, the body begins to produce less lactase. But many people may not experience symptoms until they are much older. CAUSES Things that can cause you to be lactose intolerant include:  Aging.  Being born without the ability to make lactase.  Certain digestive diseases.  Injuries to the small intestine. SYMPTOMS   Feeling sick to your stomach (nauseous).  Diarrhea.  Cramps.  Bloating.  Gas. Symptoms usually show up a half hour or 2 hours after eating or drinking products containing lactose. TREATMENT  No treatment can improve the body's ability to produce lactase. However, symptoms can be controlled through diet. A medicine may be given to you to take when you consume lactose-containing foods or drinks. The medicine contains the lactase enzyme, which help the body digest lactose better. HOME CARE INSTRUCTIONS  Eat or drink dairy products as told by your caregiver or dietician.  Take all medicine as directed by your caregiver.  Find lactose-free or lactose-reduced products at your local grocery store.  Talk to your caregiver or dietician to decide if you need any dietary supplements. The following is the amount of calcium needed from the diet:  19 to 50 years: 1000 mg  Over 50 years: 1200 mg Calcium and Lactose in Common Foods Non-Dairy Products / Calcium Content (mg)  Calcium-fortified orange juice, 1 cup / 308 to 344 mg  Sardines, with edible bones, 3 oz / 270 mg  Salmon, canned, with edible bones, 3 oz /  205 mg  Soymilk, fortified, 1 cup / 200 mg  Broccoli (raw), 1 cup / 90 mg  Orange, 1 medium / 50 mg  Pinto beans,  cup / 40 mg  Tuna, canned, 3 oz / 10 mg  Lettuce greens,  cup / 10 mg Dairy Products / Calcium Content (mg) / Lactose Content (g)  Yogurt, plain, low-fat, 1 cup / 415 mg / 5 g  Milk, reduced fat, 1 cup / 295 mg / 11 g  Swiss cheese, 1 oz / 270 mg / 1 g  Ice cream,  cup / 85 mg / 6 g  Cottage cheese,  cup / 75 mg / 2 to 3 g SEEK MEDICAL CARE IF: You have no relief from your symptoms. Document Released: 11/06/2005 Document Revised: 01/29/2012 Document Reviewed: 02/06/2014 [facility] Patient Information 2015 [place], [place]. This information is not intended to replace advice given to you by your health care provider. Make sure you discuss any questions you have with your health care provider.   I recommend you avoid milk based or lactose containing products (cow's milk, cheeses, ice cream). This may help reduce abdominal cramping and diarrhea.

## 2014-07-02 ENCOUNTER — Telehealth: Payer: Self-pay | Admitting: Adult Health

## 2014-07-02 MED ORDER — SERTRALINE HCL 25 MG PO TABS: 25.0000 mg | ORAL_TABLET | Freq: Every day | ORAL | Status: DC

## 2014-07-02 NOTE — Telephone Encounter (Signed)
The patient's sister is calling stating that the patient is having increased anxiety and some agitation. She states that her sister is sleeping a lot during the day and when she is awake she feels real jittery. The patient has had only one or 2 episodes where she has been agitated and shown some aggression. Those episodes are limited to verbally getting upset. I inquired about the patient feeling depressed. The sister thinks she may be experiencing some depression and has for several years. She states that she has never been treated for depression. At this time I would like to start the patient on a low dose of Zoloft. She'll take 25 mg daily. I have advised the sister to have a caregiver give her this medication when she is going to be round around for several hours just to make sure the patient is able to tolerate it. Sister verbalized understanding. I also went over the side effects of this medication. The sister states that the patient has had a history of seizures however she has not had a seizure in 15 years and is currently not taking any medication for seizures. I advised them to let me know how she  is tolerating this medication and if they find it helpful.

## 2014-07-02 NOTE — Telephone Encounter (Signed)
I called back.  Opal Sidles said the patient has been experiencing increased anxiety and on occasion has aggression.  She would like to know if something could be prescribed to help alleviate symptoms.  Please advise.  Thank you.

## 2014-07-02 NOTE — Telephone Encounter (Signed)
Patient's sister, Opal Sidles @ [phone], requesting Anxiety medication for patient.  Please return call anytime and may leave message if not available.

## 2014-08-30 ENCOUNTER — Other Ambulatory Visit: Payer: Self-pay | Admitting: Adult Health

## 2014-10-22 ENCOUNTER — Telehealth: Payer: Self-pay

## 2014-10-22 NOTE — Telephone Encounter (Signed)
Pts sister Vear Clock would like to know if pt can be prescribed an antibiotic for a UTI, pt is unable to come in for this issue. Best# [phone]

## 2014-10-23 NOTE — Telephone Encounter (Signed)
Patient's sister called again regarding this issue. Please advise.  CB# [phone]

## 2014-10-26 NOTE — Telephone Encounter (Signed)
Sister is going to arrange transportation to bring her in.

## 2014-10-29 ENCOUNTER — Telehealth: Payer: Self-pay | Admitting: *Deleted

## 2014-10-29 NOTE — Telephone Encounter (Signed)
LMOM for pt to come in to get flu shot.

## 2014-11-30 ENCOUNTER — Encounter (INDEPENDENT_AMBULATORY_CARE_PROVIDER_SITE_OTHER): Payer: Medicare Other | Admitting: Ophthalmology

## 2014-11-30 ENCOUNTER — Telehealth: Payer: Self-pay | Admitting: *Deleted

## 2014-11-30 DIAGNOSIS — H43813 Vitreous degeneration, bilateral: Secondary | ICD-10-CM

## 2014-11-30 DIAGNOSIS — H26493 Other secondary cataract, bilateral: Secondary | ICD-10-CM

## 2014-11-30 DIAGNOSIS — H3531 Nonexudative age-related macular degeneration: Secondary | ICD-10-CM

## 2014-11-30 NOTE — Telephone Encounter (Signed)
No voicemail set up for this patient to leave a msg regarding the flu shot.

## 2014-12-14 ENCOUNTER — Ambulatory Visit (INDEPENDENT_AMBULATORY_CARE_PROVIDER_SITE_OTHER): Payer: Medicare Other | Admitting: Ophthalmology

## 2014-12-24 ENCOUNTER — Encounter: Payer: Self-pay | Admitting: Adult Health

## 2014-12-24 ENCOUNTER — Ambulatory Visit (INDEPENDENT_AMBULATORY_CARE_PROVIDER_SITE_OTHER): Payer: Medicare Other | Admitting: Adult Health

## 2014-12-24 VITALS — BP 116/80 | HR 72 | Ht 64.0 in | Wt 193.0 lb

## 2014-12-24 DIAGNOSIS — F32A Depression, unspecified: Secondary | ICD-10-CM

## 2014-12-24 DIAGNOSIS — F329 Major depressive disorder, single episode, unspecified: Secondary | ICD-10-CM

## 2014-12-24 DIAGNOSIS — R413 Other amnesia: Secondary | ICD-10-CM

## 2014-12-24 NOTE — Progress Notes (Signed)
PATIENT: Rebecca Cross DOB: 1941/08/18  REASON FOR VISIT: follow up- memory loss, depression  HISTORY FROM: patient  HISTORY OF PRESENT ILLNESS: Rebecca Cross is a 74 year old female with a history of progressive memory disorder. She returns today for follow-up. She was started on Aricept at the last visit. Patient states that she had diarrhea but that has slowed up but caregiver states that she  has not witnessed this. She states that her memory has remained about the same although she knows that it has gotten worse. She also was started on Zoloft for depression. She states that it made her sick so her family stopped the medication. She is able to complete all ADLs independently. Caregiver states they have had issues with the family regarding how long they are there with the patient. They normally come only to give her medications. Caregiver states that she has missed meals because she needs to be prompted to go. Caregiver has made this recommendation to the family but they do not want to increase her care at this time. Caregiver also states that sometimes she will have hallucinations of seeing snakes or frogs. They did find that some of this was related to her vision because she has floaters. She has also been proactive any participating in activities at the facility.   HISTORY 06/23/14: Rebecca Cross is a 74 year old female with a history of progressive memory disorder. She returns today for follow-up. She was prescribed Namenda but is no erlonger taking it due to cost. Patient feels that her memory is "ok." patient has given up task/activities due to her memory. Sister and caregiver state that she does not walk the dog as much as she used to. 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 Sister is currently in charge of all her finances. She currently lives at [facility]. Patient is able to  complete ADLs independently according to patient and caregiver. She does have to be prompted to go eat breakfast, lunch or dinner. Caregiver states that she has had a couple of incidents where she was very abrupt at the dinner table. Caregiver states that she has mentioned suicide because she felt like a burden to people. Her caregiver took her to the church service at [employer] and after that the patient states that she did feel better. Patient denies being suicidal now. Caregiver explains that at [facility] it is independent living so some of the residents and staff do not under dementia and have complained about the patient. Therefore that has hurt the patient's feelings and lead to the discussion about suicide. Patient has also had an incident where she was out walking the dog at night and management at [facility] found her. Caregiver states at that time she was confused. Family has hired a Academic librarian to check on her to 3 times daily. Patient is not taking her medications and [facility] is not responsible for giving her medications.   REVIEW OF SYSTEMS: Out of a complete 14 system review of symptoms, the patient complains only of the following symptoms, and all other reviewed systems are negative.  Light sensitivity, diarrhea, ringing in ears, runny nose, urgency, back pain, speech difficulty, memory loss  ALLERGIES: Allergies  Allergen Reactions  . Amoxicillin Nausea And Vomiting  . Hydrocodone Nausea Only  . Oxycodone Nausea Only  . Sulfa Antibiotics Itching  .  Erythromycin Rash    HOME MEDICATIONS: Outpatient Prescriptions Prior to Visit  Medication Sig Dispense Refill  . donepezil (ARICEPT) 5 MG tablet Take 1 tablet (5 mg total) by mouth at bedtime. 30 tablet 6  . sertraline (ZOLOFT) 25 MG tablet TAKE 1 TABLET BY MOUTH AT BEDTIME 30 tablet 3   No facility-administered medications prior to visit.    PAST MEDICAL HISTORY: Past Medical History  Diagnosis  Date  . Diverticulitis of sigmoid colon     known diverticulitis of sigmoid  . Vaginal fistula     between vagina and colon  . Asthma   . Anxiety   . Depression   . Allergy   . COPD (chronic obstructive pulmonary disease)   . Seizures   . Sleep walking   . REM sleep behavior disorder   . Gallstones   . Vitamin D deficiency   . Toe fracture, left     left 4th toe  . Migraine   . Cancer     basal cell carcinoma  . Clavicle fracture     right    PAST SURGICAL HISTORY: Past Surgical History  Procedure Laterality Date  . Ankle surgery      left- pin put in  . Wisdom tooth extraction    . Tubal ligation    . Nose surgery    . Cataract extraction    . Skin cancer excision      x3, face and back  . Breast biopsy  1960    on lump, left  . Abdominal hysterectomy  1982    vaginal    FAMILY HISTORY: Family History  Problem Relation Age of Onset  . Cancer Mother     breast  . COPD Mother   . Cancer Father     skin/prostate ca  . Cancer Sister     [place] cell  . Cancer Paternal Grandmother   . Cancer Paternal Grandfather     PHYSICAL EXAM  Filed Vitals:   12/24/14 0902  BP: 116/80  Pulse: 72  Height: 5\' 4"  (1.626 m)  Weight: 193 lb (87.544 kg)   Body mass index is 33.11 kg/(m^2).  Generalized: Well developed, in no acute distress   Neurological examination  Mentation: Alert . Follows all commands speech and language fluent. MMSE 17/30 Cranial nerve II-XII: Pupils were equal round reactive to light. Extraocular movements were full, visual field were full on confrontational test. Facial sensation and strength were normal. Uvula tongue midline. Head turning and shoulder shrug  were normal and symmetric. Motor: The motor testing reveals 5 over 5 strength of all 4 extremities. Good symmetric motor tone is noted throughout.  Sensory: Sensory testing is intact to soft touch on all 4 extremities. No evidence of extinction is noted.  Coordination: Cerebellar testing  reveals good finger-nose-finger and heel-to-shin bilaterally but needs prompting with directions.  Gait and station: Gait is normal. Romberg is negative. No drift is seen.  Reflexes: Deep tendon reflexes are symmetric and normal bilaterally.     DIAGNOSTIC DATA (LABS, IMAGING, TESTING) - I reviewed patient records, labs, notes, testing and imaging myself where available.   ASSESSMENT AND PLAN 74 y.o. year old female  has a past medical history of Diverticulitis of sigmoid colon; Vaginal fistula; Asthma; Anxiety; Depression; Allergy; COPD (chronic obstructive pulmonary disease); Seizures; Sleep walking; REM sleep behavior disorder; Gallstones; Vitamin D deficiency; Toe fracture, left; Migraine; Cancer; and Clavicle fracture. here with:  1. Memory loss  Patient's memory score is 17/30. She  will continue Aricept 5 mg if Diarrhea becomes constant this medication may have to be discontinued. Patient's overall mood has improved. She was smiling at this visit. Caregiver confirms that she is proactive and signs up to participate in activities at her facility. Increased supervision may be beneficial to the patient even if its just a couple of hours a day. I will call the patient's daughter and discuss this with her. If her symptoms worsen or she develops new symptoms they should let me know. F/U in 6 months or sooner if needed.    Ward Givens, MSN, NP-C 12/24/2014, 9:07 AM [facility] [address], [place], [place] [address] [phone]  Note: This document was prepared with digital dictation and possible smart phrase technology. Any transcriptional errors that result from this process are unintentional.

## 2014-12-24 NOTE — Progress Notes (Signed)
I have read the note, and I agree with the clinical assessment and plan.  Rebecca Cross,Rebecca Cross   

## 2014-12-24 NOTE — Patient Instructions (Signed)
Continue Aricept 5 mg daily Memory score is 17/30. If increase supervision possible that may be beneficial to the patient.   If symptoms worsen please let us know.

## 2014-12-25 ENCOUNTER — Ambulatory Visit (INDEPENDENT_AMBULATORY_CARE_PROVIDER_SITE_OTHER): Payer: Medicare Other | Admitting: Ophthalmology

## 2014-12-25 DIAGNOSIS — H26492 Other secondary cataract, left eye: Secondary | ICD-10-CM

## 2014-12-28 ENCOUNTER — Telehealth: Payer: Self-pay | Admitting: Adult Health

## 2014-12-28 NOTE — Telephone Encounter (Signed)
I called the daughter and left a message for her to call us back.

## 2014-12-28 NOTE — Telephone Encounter (Signed)
Hilda Blades, caregiver for pt is calling back stating the pt's family is still waiting on a call back from [facility] regarding pt's care.  I did explain to her that [name] was out of the office on Friday.  Please call family to discuss that pt needs a few more hours of care a day.  Please call and advise.

## 2014-12-29 NOTE — Telephone Encounter (Signed)
Patient's daughter, Maren Reamer @ [phone] returning [name], NP's call.

## 2014-12-31 NOTE — Telephone Encounter (Signed)
I called and spoke to the daughter. Went over my findings during the office visit with her mother. Overall the patient's mood had improved. However she has missed going to meals due to no prompting. I recommended that the patient's caregiver spend more hours during the day with her. I think this would be helpful for her mood as well as helping her with daily things such as going to meals. Daughter verbalized understanding.

## 2015-01-11 ENCOUNTER — Other Ambulatory Visit: Payer: Self-pay | Admitting: Adult Health

## 2015-01-17 ENCOUNTER — Other Ambulatory Visit: Payer: Self-pay | Admitting: Adult Health

## 2015-02-08 ENCOUNTER — Telehealth: Payer: Self-pay | Admitting: Adult Health

## 2015-02-08 NOTE — Telephone Encounter (Signed)
Pt's daughter is calling stating pt has gotten worse and making up stories, she has been going down hill since she saw [place].  Daughter wants to talk with [name] about pt's medications.  Please call and advise.

## 2015-02-08 NOTE — Telephone Encounter (Signed)
I called the patient's daughter. She states that her mom has been making up stories. Saying things like she fell and broke her neck and is dying to the other residents in her assisted living facility. She has also called family members tell them that she is vomiting every hour but in fact she is not. The patient does have a caregiver that spends sometime with her during the day and she has noticed this as well. The family is questioning whether she needs more supervision but unfortunately if they move her she will no longer be able to  keep her dog. I'm unsure if this is related to her memory or possible depression. I will have the patient come in for revisit this week.

## 2015-02-09 NOTE — Telephone Encounter (Signed)
Spoke to Care giver she is coming in this week to see [name] .

## 2015-02-11 ENCOUNTER — Ambulatory Visit (INDEPENDENT_AMBULATORY_CARE_PROVIDER_SITE_OTHER): Payer: Medicare Other | Admitting: Adult Health

## 2015-02-11 ENCOUNTER — Encounter: Payer: Self-pay | Admitting: Adult Health

## 2015-02-11 VITALS — BP 148/72 | HR 62 | Ht 64.0 in | Wt 195.0 lb

## 2015-02-11 DIAGNOSIS — R413 Other amnesia: Secondary | ICD-10-CM | POA: Diagnosis not present

## 2015-02-11 DIAGNOSIS — R410 Disorientation, unspecified: Secondary | ICD-10-CM

## 2015-02-11 DIAGNOSIS — F4321 Adjustment disorder with depressed mood: Secondary | ICD-10-CM | POA: Diagnosis not present

## 2015-02-11 MED ORDER — MEMANTINE HCL 28 X 5 MG & 21 X 10 MG PO TABS: ORAL_TABLET | ORAL | Status: DC

## 2015-02-11 NOTE — Progress Notes (Addendum)
PATIENT: Rebecca Cross DOB: [birth year]  REASON FOR VISIT: follow up- memory HISTORY FROM: patient  HISTORY OF PRESENT ILLNESS: Rebecca Cross is a 74 year old female with a history of progressive memory disorder. She returns today for follow-up. Earlier this week her daughter called and expressed concerned over the patient's recent behavior. She states that her mother was making up stories. She would call family members and tell them that she was vomiting every hour. She also told other residents that she fell and broke her neck and would die. In the past the patient has exhibited some behaviors that was felt to be due to depression. At that time the family did not want her start on any antidepressant therapy. Caregiver spoke to me in private today she states that the patient's confusion has gradually gotten worse since the last visit however in the last 1-2 weeks she needs noticed the dramatic decline. She states the patient tends to substitute words. She also may be having hallucinations. The caregiver does feel that she may be depressed at times. She states that she does well when she has someone there with her that she can socialize with. The patient states that she feels that her memory is worse. She is able to complete ADLs independently. She no longer operates a motor vehicle. She states that she does feel depressed at times in the past she has wanted to try an antidepressant but her family was resistant according to the patient. She is currently on Aricept for her memory she has tried Namenda in the past but cannot afford the extended release. She returns today for an evaluation.  HISTORY 12/24/14: Rebecca Cross is a 74 year old female with a history of progressive memory disorder. She returns today for follow-up. She was started on Aricept at the last visit. Patient states that she had diarrhea but that has slowed up but caregiver states that she has not witnessed this. She states that her memory has  remained about the same although she knows that it has gotten worse. She also was started on Zoloft for depression. She states that it made her sick so her family stopped the medication. She is able to complete all ADLs independently. Caregiver states they have had issues with the family regarding how long they are there with the patient. They normally come only to give her medications. Caregiver states that she has missed meals because she needs to be prompted to go. Caregiver has made this recommendation to the family but they do not want to increase her care at this time. Caregiver also states that sometimes she will have hallucinations of seeing snakes or frogs. They did find that some of this was related to her vision because she has floaters. She has also been proactive any participating in activities at the facility.   HISTORY 06/23/14: Rebecca Cross is a 74 year old female with a history of progressive memory disorder. She returns today for follow-up. She was prescribed Namenda but is no erlonger taking it due to cost. Patient feels that her memory is "ok." patient has given up task/activities due to her memory. Sister and caregiver state that she does not walk the dog as much as she used to. 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 Sister is currently in charge of all her finances. She currently lives at [facility]. Patient is able to complete  ADLs independently according to patient and caregiver. She does have to be prompted to go eat breakfast, lunch or dinner. Caregiver states that she has had a couple of incidents where she was very abrupt at the dinner table. Caregiver states that she has mentioned suicide because she felt like a burden to people. Her caregiver took her to the church service at [employer] and after that the patient states that she did feel better. Patient denies  being suicidal now. Caregiver explains that at [facility] it is independent living so some of the residents and staff do not under dementia and have complained about the patient. Therefore that has hurt the patient's feelings and lead to the discussion about suicide. Patient has also had an incident where she was out walking the dog at night and management at [facility] found her. Caregiver states at that time she was confused. Family has hired a Academic librarian to check on her to 3 times daily. Patient is not taking her medications and [facility] is not responsible for giving her medications.  REVIEW OF SYSTEMS: Out of a complete 14 system review of symptoms, the patient complains only of the following symptoms, and all other reviewed systems are negative.  Ringing in ears, incontinence of bladder, frequency of urination, urgency, acting out dreams, memory loss, speech difficulty, confusion, nervous/anxious  ALLERGIES: Allergies  Allergen Reactions  . Amoxicillin Nausea And Vomiting  . Hydrocodone Nausea Only  . Oxycodone Nausea Only  . Sulfa Antibiotics Itching  . Erythromycin Rash    HOME MEDICATIONS: Outpatient Prescriptions Prior to Visit  Medication Sig Dispense Refill  . donepezil (ARICEPT) 5 MG tablet TAKE 1 TABLET BY MOUTH AT BEDTIME 30 tablet 6  . donepezil (ARICEPT) 5 MG tablet TAKE 1 TABLET BY MOUTH AT BEDTIME 30 tablet 5  . Multiple Vitamins-Minerals (EYE VITAMINS PO) Take by mouth 2 (two) times daily.     No facility-administered medications prior to visit.    PAST MEDICAL HISTORY: Past Medical History  Diagnosis Date  . Diverticulitis of sigmoid colon     known diverticulitis of sigmoid  . Vaginal fistula     between vagina and colon  . Asthma   . Anxiety   . Depression   . Allergy   . COPD (chronic obstructive pulmonary disease)   . Seizures   . Sleep walking   . REM sleep behavior disorder   . Gallstones   . Vitamin D deficiency   . Toe  fracture, left     left 4th toe  . Migraine   . Cancer     basal cell carcinoma  . Clavicle fracture     right    PAST SURGICAL HISTORY: Past Surgical History  Procedure Laterality Date  . Ankle surgery      left- pin put in  . Wisdom tooth extraction    . Tubal ligation    . Nose surgery    . Cataract extraction    . Skin cancer excision      x3, face and back  . Breast biopsy  1960    on lump, left  . Abdominal hysterectomy  1982    vaginal    FAMILY HISTORY: Family History  Problem Relation Age of Onset  . Cancer Mother     breast  . COPD Mother   . Cancer Father     skin/prostate ca  . Cancer Sister     [place] cell  . Cancer Paternal Grandmother   .  Cancer Paternal Grandfather     SOCIAL HISTORY: History   Social History  . Marital Status: Divorced    Spouse Name: N/A  . Number of Children: 1  . Years of Education: COLLEGE   Occupational History  .     Social History Main Topics  . Smoking status: Former Smoker    Quit date: 06/29/1963  . Smokeless tobacco: Never Used  . Alcohol Use: No  . Drug Use: No  . Sexual Activity: No   Other Topics Concern  . Not on file   Social History Narrative   Patient is right handed, resides alone      PHYSICAL EXAM  Filed Vitals:   02/11/15 1326  BP: 148/72  Pulse: 62  Height: 5\' 4"  (1.626 m)  Weight: 195 lb (88.451 kg)   Body mass index is 33.46 kg/(m^2).  Generalized: Well developed, in no acute distress   Neurological examination  Mentation: Alert.. Follows all commands. Speech is clear but tends to substitute words. Her sentences tend to not make sense. Cranial nerve II-XII: Pupils were equal round reactive to light. Extraocular movements were full, visual field were full on confrontational test. Facial sensation and strength were normal. Uvula tongue midline. Head turning and shoulder shrug  were normal and symmetric. Motor: The motor testing reveals 5 over 5 strength of all 4 extremities.  Good symmetric motor tone is noted throughout.  Sensory: Sensory testing is intact to soft touch on all 4 extremities. No evidence of extinction is noted.  Coordination: Cerebellar testing reveals good finger-nose-finger and heel-to-shin bilaterally but requires multiple prompting.  Gait and station: Gait is normal. Tandem gait is slightly unsteady. Romberg is negative. No drift is seen.  Reflexes: Deep tendon reflexes are symmetric and normal bilaterally.    DIAGNOSTIC DATA (LABS, IMAGING, TESTING) - I reviewed patient records, labs, notes, testing and imaging myself where available.     ASSESSMENT AND PLAN 74 y.o. year old female  has a past medical history of Diverticulitis of sigmoid colon; Vaginal fistula; Asthma; Anxiety; Depression; Allergy; COPD (chronic obstructive pulmonary disease); Seizures; Sleep walking; REM sleep behavior disorder; Gallstones; Vitamin D deficiency; Toe fracture, left; Migraine; Cancer; and Clavicle fracture. here with:  1. Progressive memory loss 2. Increased confusion 3. Depression  Caregiver feels that her confusion has gotten worse in the last 1-2 weeks. Today on exam the patient has trouble with her speech. She tends to substitute words and her sentences do not make sense. I do not recall this at her last visit. Her MMSE today is 15/30 was previously 17/30. I will check blood work as well as a urinalysis today to look for an acute infection that could have caused her some confusion. She will continue on Aricept. I will try the patient on Namenda and we will do the generic 10 mg twice a day once she finishes the titration pack. I do feel that the patient probably has an underlying depression. Her family is unable to visit her frequently. She does state that she is lonely at times. After she has been on Namenda for 2-3 weeks and we know she is able to tolerate it I will consider starting Zoloft for her depression. Patient is amenable to this and her daughter  Di Kindle is aware as well. Also gave Di Kindle the number to care patrol for possible resources to give her more supervision during the day. If the patient's symptoms worsen or she develops new symptoms she'll let us know. Otherwise she will return  in 3-4 months.    Ward Givens, MSN, NP-C 02/11/2015, 1:19 PM [facility] [address] [place], [place] [address] [phone]  Note: This document was prepared with digital dictation and possible smart phrase technology. Any transcriptional errors that result from this process are unintentional.

## 2015-02-11 NOTE — Patient Instructions (Signed)
We will check blood work today. We will start Namenda titration pack. If tolerated then we can start Namenda 10 mg BID>  After one month on Namenda we may consider starting low dose of zoloft 25 mg.

## 2015-02-11 NOTE — Progress Notes (Signed)
I have read the note, and I agree with the clinical assessment and plan.  Evert Wenrich KEITH   

## 2015-02-12 ENCOUNTER — Telehealth: Payer: Self-pay | Admitting: Adult Health

## 2015-02-12 LAB — COMPREHENSIVE METABOLIC PANEL
A/G RATIO: 1.6 (ref 1.1–2.5)
ALK PHOS: 97 IU/L (ref 39–117)
ALT: 10 IU/L (ref 0–32)
AST: 19 IU/L (ref 0–40)
Albumin: 4.4 g/dL (ref 3.5–4.8)
BUN/Creatinine Ratio: 15 (ref 11–26)
BUN: 14 mg/dL (ref 8–27)
Bilirubin Total: 0.3 mg/dL (ref 0.0–1.2)
CALCIUM: 9.2 mg/dL (ref 8.7–10.3)
CHLORIDE: 103 mmol/L (ref 97–108)
CO2: 23 mmol/L (ref 18–29)
Creatinine, Ser: 0.96 mg/dL (ref 0.57–1.00)
GFR calc Af Amer: 68 mL/min/{1.73_m2} (ref >59)
GFR, EST NON AFRICAN AMERICAN: 59 mL/min/{1.73_m2} — AB (ref >59)
GLUCOSE: 119 mg/dL — AB (ref 65–99)
Globulin, Total: 2.7 g/dL (ref 1.5–4.5)
POTASSIUM: 4.6 mmol/L (ref 3.5–5.2)
SODIUM: 142 mmol/L (ref 134–144)
TOTAL PROTEIN: 7.1 g/dL (ref 6.0–8.5)

## 2015-02-12 LAB — CBC WITH DIFFERENTIAL/PLATELET
BASOS ABS: 0 10*3/uL (ref 0.0–0.2)
Basos: 0 %
EOS ABS: 0.4 10*3/uL (ref 0.0–0.4)
Eos: 4 %
HEMATOCRIT: 40.3 % (ref 34.0–46.6)
Hemoglobin: 13.2 g/dL (ref 11.1–15.9)
IMMATURE GRANULOCYTES: 0 %
Immature Grans (Abs): 0 10*3/uL (ref 0.0–0.1)
LYMPHS: 21 %
Lymphocytes Absolute: 2.2 10*3/uL (ref 0.7–3.1)
MCH: 29.7 pg (ref 26.6–33.0)
MCHC: 32.8 g/dL (ref 31.5–35.7)
MCV: 91 fL (ref 79–97)
Monocytes Absolute: 0.6 10*3/uL (ref 0.1–0.9)
Monocytes: 6 %
NEUTROS ABS: 7.2 10*3/uL — AB (ref 1.4–7.0)
Neutrophils Relative %: 69 %
PLATELETS: 198 10*3/uL (ref 150–379)
RBC: 4.45 x10E6/uL (ref 3.77–5.28)
RDW: 13.6 % (ref 12.3–15.4)
WBC: 10.5 10*3/uL (ref 3.4–10.8)

## 2015-02-12 LAB — URINALYSIS
BILIRUBIN UA: NEGATIVE
GLUCOSE, UA: NEGATIVE
KETONES UA: NEGATIVE
Nitrite, UA: NEGATIVE
Protein, UA: NEGATIVE
Specific Gravity, UA: 1.011 (ref 1.005–1.030)
Urobilinogen, Ur: 0.2 mg/dL (ref 0.2–1.0)
pH, UA: 5.5 (ref 5.0–7.5)

## 2015-02-12 NOTE — Telephone Encounter (Signed)
I called the patient's daughter. Lab work was okay. She will begin on Namenda. If she is able to tolerate this we will start her on a low-dose of Zoloft as well. Daughter agrees that the patient has had symptoms of depression. I have suggested that the pace program may be beneficial for the patient. The daughter plans to call and look into this.

## 2015-02-16 ENCOUNTER — Telehealth: Payer: Self-pay | Admitting: Adult Health

## 2015-02-16 NOTE — Telephone Encounter (Signed)
We do not have the patient's Rx coverage on file.  I called the pharmacy.  Spoke with Lanny Hurst.  He said her ID # is [id number], and coverage is through [employer].  I contacted ins and provided clinical info.  Request is under review.  Ref Key: [id number].  I called patient back to advise.  Got no answer.  Left message.

## 2015-02-16 NOTE — Telephone Encounter (Signed)
Caregiver is calling stating insurance has denied memantine (NAMENDA TITRATION PAK) tablet pack and it cost $298.  Needs to try and get another prior authorization or coupon for drug.  Please call and advise.

## 2015-02-23 ENCOUNTER — Telehealth: Payer: Self-pay | Admitting: Adult Health

## 2015-02-23 NOTE — Telephone Encounter (Signed)
Called and spoke to [place] and she stated [name] told us about [facility]. The family had dicussed and we would like for her to go if her insurance will be approved. Please give [place] 24-48 hours to respond Hilda Blades understood process. Meagan form in your in box.

## 2015-02-23 NOTE — Telephone Encounter (Signed)
I called and spoke with Robin at the pharmacy who verified the Rx went through ins, and patient has already picked up meds.

## 2015-02-23 NOTE — Telephone Encounter (Signed)
Patient's caregiver Hilda Blades @ [phone], questioning status of Adult Daycare for patient as discussed at last OV.  Hilda Blades stated patient is getting for depressed and they are trying to get her out of the house and engaged in activities.  Please call and advise.

## 2015-02-24 NOTE — Telephone Encounter (Signed)
Faxed form to [facility].

## 2015-02-24 NOTE — Telephone Encounter (Signed)
Form completed. Given to [place].

## 2015-03-11 MED ORDER — MEMANTINE HCL 10 MG PO TABS: 10.0000 mg | ORAL_TABLET | Freq: Two times a day (BID) | ORAL | Status: DC

## 2015-03-11 NOTE — Telephone Encounter (Signed)
Patients caregiver called and stated that the patient had been on Rx memantine (NAMENDA TITRATION PAK) tablet pack for 2 weeks and 2 days with no side effects. Please call and advise. DW

## 2015-03-11 NOTE — Addendum Note (Signed)
Addended by: Margette Fast on: 03/11/2015 12:12 PM   Modules accepted: Orders, Medications

## 2015-03-11 NOTE — Telephone Encounter (Signed)
I called patient. I talk with the daughter. The patient is doing well within the Namenda titration pack. I'll call in a maintenance dose of 10 mg twice daily.

## 2015-05-18 ENCOUNTER — Ambulatory Visit: Payer: Medicare Other | Admitting: Adult Health

## 2015-05-19 ENCOUNTER — Telehealth: Payer: Self-pay

## 2015-05-19 NOTE — Telephone Encounter (Signed)
Called and left message to see if patient would like to come in earlier if we get a call back please connect to Me . Link me patient apt. 05/20/2015. Arrive at 3:45 for 4:00 apt. Thanks Hinton Dyer.

## 2015-05-20 ENCOUNTER — Encounter: Payer: Self-pay | Admitting: Adult Health

## 2015-05-20 ENCOUNTER — Ambulatory Visit (INDEPENDENT_AMBULATORY_CARE_PROVIDER_SITE_OTHER): Payer: Medicare Other | Admitting: Adult Health

## 2015-05-20 VITALS — BP 138/70 | HR 62 | Ht 66.0 in | Wt 198.0 lb

## 2015-05-20 DIAGNOSIS — R413 Other amnesia: Secondary | ICD-10-CM

## 2015-05-20 NOTE — Patient Instructions (Signed)
Continue namenda and aricept. If symptoms worsen please let us know.

## 2015-05-20 NOTE — Progress Notes (Signed)
PATIENT: Rebecca Cross DOB: [birth year]  REASON FOR VISIT: follow up- progressive memory disorder HISTORY FROM: patient  HISTORY OF PRESENT ILLNESS: Rebecca Cross is a 74 year old female with a history of progressive memory disorder. She returns today for follow-up. She is chronically taking Namenda and Aricept. Her caregiver reports that she feels her memory has remained stable. The patient continues to live in an independent living facility. Caregiver states that she no longer feels that depression is an issue for the patient. She states the patient has not been talking about being left out or any suicidal thoughts. She states the patient is now sitting in the dining [place] with her friends to eat. Patient states that she is able to do most things for herself. She continues to walk her dog. Denies any issues of getting lost. Patient states that she wishes she could get out and do more things. According to the patient she has requested this from her family but they have other obligations right now. Caregiver states that she has not had any hallucinations. Denies any agitation or aggressiveness. Caregiver feels that the patient is craving more attention. She states that she wishes her family could come around more. Patient reports that she is having some issues with urinary incontinence. She plans to follow-up with her primary care provider. She returns today for an evaluation.   HISTORY 02/11/15: Rebecca Cross is a 74 year old female with a history of progressive memory disorder. She returns today for follow-up. Earlier this week her daughter called and expressed concerned over the patient's recent behavior. She states that her mother was making up stories. She would call family members and tell them that she was vomiting every hour. She also told other residents that she fell and broke her neck and would die. In the past the patient has exhibited some behaviors that was felt to be due to depression. At that  time the family did not want her start on any antidepressant therapy. Caregiver spoke to me in private today she states that the patient's confusion has gradually gotten worse since the last visit however in the last 1-2 weeks she needs noticed the dramatic decline. She states the patient tends to substitute words. She also may be having hallucinations. The caregiver does feel that she may be depressed at times. She states that she does well when she has someone there with her that she can socialize with. The patient states that she feels that her memory is worse. She is able to complete ADLs independently. She no longer operates a motor vehicle. She states that she does feel depressed at times in the past she has wanted to try an antidepressant but her family was resistant according to the patient. She is currently on Aricept for her memory she has tried Namenda in the past but cannot afford the extended release. She returns today for an evaluation.   REVIEW OF SYSTEMS: Out of a complete 14 system review of symptoms, the patient complains only of the following symptoms, and all other reviewed systems are negative.  He history of present illness  ALLERGIES: Allergies  Allergen Reactions  . Amoxicillin Nausea And Vomiting  . Hydrocodone Nausea Only  . Oxycodone Nausea Only  . Sulfa Antibiotics Itching  . Erythromycin Rash    HOME MEDICATIONS: Outpatient Prescriptions Prior to Visit  Medication Sig Dispense Refill  . donepezil (ARICEPT) 5 MG tablet TAKE 1 TABLET BY MOUTH AT BEDTIME 30 tablet 5  . memantine (NAMENDA) 10 MG tablet  Take 1 tablet (10 mg total) by mouth 2 (two) times daily. 60 tablet 5  . Multiple Vitamins-Minerals (EYE VITAMINS PO) Take by mouth 2 (two) times daily.     No facility-administered medications prior to visit.    PAST MEDICAL HISTORY: Past Medical History  Diagnosis Date  . Diverticulitis of sigmoid colon     known diverticulitis of sigmoid  . Vaginal fistula       between vagina and colon  . Asthma   . Anxiety   . Depression   . Allergy   . COPD (chronic obstructive pulmonary disease)   . Seizures   . Sleep walking   . REM sleep behavior disorder   . Gallstones   . Vitamin D deficiency   . Toe fracture, left     left 4th toe  . Migraine   . Cancer     basal cell carcinoma  . Clavicle fracture     right    PAST SURGICAL HISTORY: Past Surgical History  Procedure Laterality Date  . Ankle surgery      left- pin put in  . Wisdom tooth extraction    . Tubal ligation    . Nose surgery    . Cataract extraction    . Skin cancer excision      x3, face and back  . Breast biopsy  1960    on lump, left  . Abdominal hysterectomy  1982    vaginal    FAMILY HISTORY: Family History  Problem Relation Age of Onset  . Cancer Mother     breast  . COPD Mother   . Cancer Father     skin/prostate ca  . Cancer Sister     [place] cell  . Cancer Paternal Grandmother   . Cancer Paternal Grandfather        PHYSICAL EXAM  Filed Vitals:   05/20/15 1605  BP: 138/70  Pulse: 62  Height: 5\' 6"  (1.676 m)  Weight: 198 lb (89.812 kg)   Body mass index is 31.97 kg/(m^2).  Generalized: Well developed, in no acute distress   Neurological examination  Mentation: Alert. Follows all commands speech and language fluent. MMSE 18/30 Cranial nerve II-XII: Pupils were equal round reactive to light. Extraocular movements were full, visual field were full on confrontational test. Facial sensation and strength were normal. Uvula tongue midline. Head turning and shoulder shrug  were normal and symmetric. Motor: The motor testing reveals 5 over 5 strength of all 4 extremities. Good symmetric motor tone is noted throughout.  Sensory: Sensory testing is intact to soft touch on all 4 extremities. No evidence of extinction is noted.  Coordination: Cerebellar testing reveals good finger-nose-finger and heel-to-shin bilaterally.  Gait and station: Gait is  normal. Tandem gait is normal. Romberg is negative. No drift is seen.  Reflexes: Deep tendon reflexes are symmetric and normal bilaterally.  Marland Kitchen   DIAGNOSTIC DATA (LABS, IMAGING, TESTING) - I reviewed patient records, labs, notes, testing and imaging myself where available.  Lab Results  Component Value Date   WBC 10.5 02/11/2015   HGB 13.2 02/11/2015   HCT 40.3 02/11/2015   MCV 91 02/11/2015   PLT 198 02/11/2015      Component Value Date/Time   NA 142 02/11/2015 1413   NA 142 07/31/2013 0747   K 4.6 02/11/2015 1413   CL 103 02/11/2015 1413   CO2 23 02/11/2015 1413   GLUCOSE 119* 02/11/2015 1413   GLUCOSE 83 07/31/2013 0747   BUN  14 02/11/2015 1413   BUN 15 07/31/2013 0747   CREATININE 0.96 02/11/2015 1413   CREATININE 0.80 07/31/2013 0747   CALCIUM 9.2 02/11/2015 1413   PROT 7.1 02/11/2015 1413   PROT 6.6 07/31/2013 0747   ALBUMIN 3.8 07/31/2013 0747   AST 19 02/11/2015 1413   ALT 10 02/11/2015 1413   ALKPHOS 97 02/11/2015 1413   BILITOT 0.3 02/11/2015 1413   BILITOT 0.5 07/31/2013 0747   GFRNONAA 66* 02/11/2015 1413   GFRAA 68 02/11/2015 1413       ASSESSMENT AND PLAN 74 y.o. year old female  has a past medical history of Diverticulitis of sigmoid colon; Vaginal fistula; Asthma; Anxiety; Depression; Allergy; COPD (chronic obstructive pulmonary disease); Seizures; Sleep walking; REM sleep behavior disorder; Gallstones; Vitamin D deficiency; Toe fracture, left; Migraine; Cancer; and Clavicle fracture. here with:  1. Progressive memory disorder  Overall the patient has remained stable. Her MMSE today is 18/30 was previously 15/30. She is no longer having signs and symptoms of depression according to the caregiver. For now she will continue the Aricept and Namenda. I have advised the patient that if her symptoms worsen or she develops new symptoms  she'll let Rebecca know. Otherwise she will follow-up in 6 months or sooner if needed.    Ward Givens, MSN, NP-C 05/20/2015,  4:19 PM [facility] [address] [place], [place] [address] [phone]  Note: This document was prepared with digital dictation and possible smart phrase technology. Any transcriptional errors that result from this process are unintentional.

## 2015-05-20 NOTE — Progress Notes (Signed)
I have read the note, and I agree with the clinical assessment and plan.  Rebecca Cross,Rebecca Cross   

## 2015-06-28 ENCOUNTER — Ambulatory Visit: Payer: Medicare Other | Admitting: Neurology

## 2015-07-16 ENCOUNTER — Telehealth: Payer: Self-pay | Admitting: Neurology
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
## 2015-07-19 NOTE — Telephone Encounter (Signed)
Called Neoma Laming back which is her care taker. Neoma Laming called and spoke to Rebecca Cross which is patient's sister and her POA. Care  Taker Neoma Laming  and Rebecca Cross have came to resolution and Rebecca Cross is going to pay Neoma Laming more for more time with her mother. Per Rebecca Cross and Neoma Laming for Now they will hold off on assisted living. Please tell [name] thank you.

## 2015-07-19 NOTE — Telephone Encounter (Signed)
Noted. Thanks.

## 2015-07-19 NOTE — Telephone Encounter (Signed)
I have spoken to the family in the past before. Is it possible to get an appointment with the caregiver/patient and a family member?

## 2015-07-19 NOTE — Telephone Encounter (Signed)
Spoke to [employer] Patient's care giver and she can bring her to apt like she has been doing Does a family member have to be at visit ?

## 2015-07-19 NOTE — Telephone Encounter (Signed)
Megan advise please.

## 2015-07-19 NOTE — Telephone Encounter (Signed)
Hinton Dyer, please call the patient's caregiver and let her know that it may be best that she have this discussion with the family member if she feels that the patient needs assisted living.

## 2015-07-24 ENCOUNTER — Encounter (HOSPITAL_COMMUNITY): Payer: Self-pay | Admitting: Emergency Medicine

## 2015-07-24 ENCOUNTER — Emergency Department (HOSPITAL_COMMUNITY)
Admission: EM | Admit: 2015-07-24 | Discharge: 2015-07-24 | Disposition: A | Discharge status: Nursing Home (Includes ICF) | Payer: Medicare Other | Attending: Emergency Medicine | Admitting: Emergency Medicine

## 2015-07-24 ENCOUNTER — Emergency Department (HOSPITAL_COMMUNITY): Payer: Medicare Other

## 2015-07-24 DIAGNOSIS — N390 Urinary tract infection, site not specified: Secondary | ICD-10-CM | POA: Insufficient documentation

## 2015-07-24 DIAGNOSIS — Z85828 Personal history of other malignant neoplasm of skin: Secondary | ICD-10-CM | POA: Insufficient documentation

## 2015-07-24 DIAGNOSIS — R630 Anorexia: Secondary | ICD-10-CM | POA: Insufficient documentation

## 2015-07-24 DIAGNOSIS — Z8781 Personal history of (healed) traumatic fracture: Secondary | ICD-10-CM | POA: Insufficient documentation

## 2015-07-24 DIAGNOSIS — Z79899 Other long term (current) drug therapy: Secondary | ICD-10-CM | POA: Insufficient documentation

## 2015-07-24 DIAGNOSIS — J45909 Unspecified asthma, uncomplicated: Secondary | ICD-10-CM | POA: Insufficient documentation

## 2015-07-24 DIAGNOSIS — Z8639 Personal history of other endocrine, nutritional and metabolic disease: Secondary | ICD-10-CM | POA: Diagnosis not present

## 2015-07-24 DIAGNOSIS — N321 Vesicointestinal fistula: Secondary | ICD-10-CM

## 2015-07-24 DIAGNOSIS — J449 Chronic obstructive pulmonary disease, unspecified: Secondary | ICD-10-CM | POA: Diagnosis not present

## 2015-07-24 DIAGNOSIS — R531 Weakness: Secondary | ICD-10-CM | POA: Diagnosis not present

## 2015-07-24 DIAGNOSIS — G43909 Migraine, unspecified, not intractable, without status migrainosus: Secondary | ICD-10-CM | POA: Insufficient documentation

## 2015-07-24 DIAGNOSIS — K632 Fistula of intestine: Secondary | ICD-10-CM | POA: Insufficient documentation

## 2015-07-24 DIAGNOSIS — Z8659 Personal history of other mental and behavioral disorders: Secondary | ICD-10-CM | POA: Insufficient documentation

## 2015-07-24 DIAGNOSIS — Z87891 Personal history of nicotine dependence: Secondary | ICD-10-CM | POA: Diagnosis not present

## 2015-07-24 DIAGNOSIS — R52 Pain, unspecified: Secondary | ICD-10-CM

## 2015-07-24 DIAGNOSIS — R110 Nausea: Secondary | ICD-10-CM | POA: Diagnosis present

## 2015-07-24 DIAGNOSIS — R197 Diarrhea, unspecified: Secondary | ICD-10-CM | POA: Diagnosis not present

## 2015-07-24 LAB — URINE MICROSCOPIC-ADD ON

## 2015-07-24 LAB — CBC WITH DIFFERENTIAL/PLATELET
BASOS ABS: 0.1 10*3/uL (ref 0.0–0.1)
BASOS PCT: 1 % (ref 0–1)
EOS ABS: 0.1 10*3/uL (ref 0.0–0.7)
Eosinophils Relative: 1 % (ref 0–5)
HEMATOCRIT: 43.5 % (ref 36.0–46.0)
Hemoglobin: 14 g/dL (ref 12.0–15.0)
Lymphocytes Relative: 22 % (ref 12–46)
Lymphs Abs: 2.2 10*3/uL (ref 0.7–4.0)
MCH: 29.5 pg (ref 26.0–34.0)
MCHC: 32.2 g/dL (ref 30.0–36.0)
MCV: 91.8 fL (ref 78.0–100.0)
MONOS PCT: 5 % (ref 3–12)
Monocytes Absolute: 0.5 10*3/uL (ref 0.1–1.0)
NEUTROS ABS: 7 10*3/uL (ref 1.7–7.7)
NEUTROS PCT: 71 % (ref 43–77)
Platelets: 177 10*3/uL (ref 150–400)
RBC: 4.74 MIL/uL (ref 3.87–5.11)
RDW: 13.9 % (ref 11.5–15.5)
WBC: 9.9 10*3/uL (ref 4.0–10.5)

## 2015-07-24 LAB — COMPREHENSIVE METABOLIC PANEL
ALBUMIN: 4.6 g/dL (ref 3.5–5.0)
ALK PHOS: 97 U/L (ref 38–126)
ALT: 13 U/L — AB (ref 14–54)
AST: 27 U/L (ref 15–41)
Anion gap: 8 (ref 5–15)
BUN: 20 mg/dL (ref 6–20)
CALCIUM: 9.4 mg/dL (ref 8.9–10.3)
CO2: 24 mmol/L (ref 22–32)
CREATININE: 0.93 mg/dL (ref 0.44–1.00)
Chloride: 109 mmol/L (ref 101–111)
GFR calc non Af Amer: 60 mL/min — ABNORMAL LOW (ref >60)
GLUCOSE: 89 mg/dL (ref 65–99)
Potassium: 3.9 mmol/L (ref 3.5–5.1)
Sodium: 141 mmol/L (ref 135–145)
Total Bilirubin: 0.6 mg/dL (ref 0.3–1.2)
Total Protein: 8.4 g/dL — ABNORMAL HIGH (ref 6.5–8.1)

## 2015-07-24 LAB — LIPASE, BLOOD: LIPASE: 23 U/L (ref 22–51)

## 2015-07-24 LAB — URINALYSIS, ROUTINE W REFLEX MICROSCOPIC
BILIRUBIN URINE: NEGATIVE
Glucose, UA: NEGATIVE mg/dL
KETONES UR: 15 mg/dL — AB
NITRITE: NEGATIVE
Protein, ur: 30 mg/dL — AB
Specific Gravity, Urine: 1.024 (ref 1.005–1.030)
Urobilinogen, UA: 0.2 mg/dL (ref 0.0–1.0)
pH: 5.5 (ref 5.0–8.0)

## 2015-07-24 LAB — TROPONIN I: Troponin I: <0.03 ng/mL (ref <0.031)

## 2015-07-24 MED ORDER — IOHEXOL 300 MG/ML  SOLN: 100.0000 mL | Freq: Once | INTRAMUSCULAR | Status: DC | PRN

## 2015-07-24 MED ORDER — CEFTRIAXONE SODIUM 1 G IJ SOLR
1.0000 g | Freq: Once | INTRAMUSCULAR | Status: AC
  Administered 2015-07-24: 1 g via INTRAMUSCULAR
  Filled 2015-07-24: qty 10

## 2015-07-24 MED ORDER — IOHEXOL 300 MG/ML  SOLN: 50.0000 mL | Freq: Once | INTRAMUSCULAR | Status: DC | PRN

## 2015-07-24 MED ORDER — STERILE WATER FOR INJECTION IJ SOLN
INTRAMUSCULAR | Status: AC
  Administered 2015-07-24: 2.1 mL
  Filled 2015-07-24: qty 10

## 2015-07-24 MED ORDER — CEPHALEXIN 500 MG PO CAPS: 500.0000 mg | ORAL_CAPSULE | Freq: Four times a day (QID) | ORAL | Status: AC

## 2015-07-24 MED ORDER — DEXTROSE 5 % IV SOLN
1.0000 g | Freq: Once | INTRAVENOUS | Status: DC
  Filled 2015-07-24: qty 10

## 2015-07-24 NOTE — ED Notes (Signed)
Pt ambulatory to CT

## 2015-07-24 NOTE — ED Notes (Signed)
RN unsuccessful x2 for IV start. 2nd RN at bedside to attempt.

## 2015-07-24 NOTE — ED Notes (Addendum)
PER EMS- pt picked up from [facility]- independent living facility c/o nausea and diarrhea x1 year.  No vomiting.  Pt has hx of dementia. Denies pain, but reports abdominal discomfort.  No caregivers noted with pt at this time.  Daughter contact info is Joellen Jersey, [phone], staff at facility reported "they are never able to get a hold of her".

## 2015-07-24 NOTE — ED Notes (Signed)
Bed: UE28 Expected date:  Expected time:  Means of arrival:  Comments: EMS-Emesis/diarrhea

## 2015-07-24 NOTE — ED Notes (Signed)
IV attempts remain unsuccessful. Family questioning need for CT scan. Caregiver reports she has been with pt every day and that she does not have diarrhea, that she just urinates very frequently which they attribute to the UTI. Sts this has been going on for approx 2 weeks, along with increased confusion. Informed MD of family's hesitation for scan, he will speak with them shortly to explain rationale. Another RN requested to continue attempt for IV access for IV abx administration via US guidance.

## 2015-07-24 NOTE — ED Notes (Signed)
Pt taken to bathroom by NT to collect urine sample. Pt noted to be knocking on bathroom door from the inside. RN in to assist and pt unable to follow commands regarding using the restroom and collecting a sample. Does not recognize a toilet or where to place toilet paper or used paper towels. Pt requested to use restroom again within 5 minutes. No diarrhea, denies abd pain. Only passing flatus in restroom. Sts she has had diarrhea "for a year or two. I have both, the diarrhea that comes out fast and the one you have to put pressure on." Pt can relate that she has children in the [facility] and [place] areas and that she lives in a nursing home by herself. Pt takes great prompting to perform basic self care tasks in dept but does appear hygenic upon arrival.

## 2015-07-24 NOTE — ED Provider Notes (Signed)
CSN: [id number]     Arrival date & time 07/24/15  0857 History   First MD Initiated Contact with Patient 07/24/15 [phone]     Chief Complaint  Patient presents with  . Nausea  . Diarrhea     (Consider location/radiation/quality/duration/timing/severity/associated sxs/prior Treatment) HPI Comments: Level 5 caveat for dementia. Patient is independent living complaining of nausea and intermittent diarrhea for the past year. History limited by dementia. She reports she is not had any vomiting and no diarrhea today. She does not recall why EMS was called. She states she's never seen her doctor about this issue. Reports having intermittent. Diarrhea for the past several years it is sporadic and comes and goes. She is not seeing her doctor about it. No vomiting or fever. No recent travel. She denies any abdominal pain but states her abdomen feels uncomfortable. No caregivers with patient. She is alert and oriented 2. She has no chest pain or shortness of breath. No urinary symptoms.  The history is provided by the patient and the EMS personnel. The history is limited by the condition of the patient.    Past Medical History  Diagnosis Date  . Diverticulitis of sigmoid colon     known diverticulitis of sigmoid  . Vaginal fistula     between vagina and colon  . Asthma   . Anxiety   . Depression   . Allergy   . COPD (chronic obstructive pulmonary disease)   . Seizures   . Sleep walking   . REM sleep behavior disorder   . Gallstones   . Vitamin D deficiency   . Toe fracture, left     left 4th toe  . Migraine   . Cancer     basal cell carcinoma  . Clavicle fracture     right   Past Surgical History  Procedure Laterality Date  . Ankle surgery      left- pin put in  . Wisdom tooth extraction    . Tubal ligation    . Nose surgery    . Cataract extraction    . Skin cancer excision      x3, face and back  . Breast biopsy  1960    on lump, left  . Abdominal hysterectomy  1982     vaginal   Family History  Problem Relation Age of Onset  . Cancer Mother     breast  . COPD Mother   . Cancer Father     skin/prostate ca  . Cancer Sister     [place] cell  . Cancer Paternal Grandmother   . Cancer Paternal Grandfather    Social History  Substance Use Topics  . Smoking status: Former Smoker    Quit date: 06/29/1963  . Smokeless tobacco: Never Used  . Alcohol Use: No   OB History    No data available     Review of Systems  Constitutional: Positive for fever, activity change and appetite change. Negative for fatigue.  HENT: Negative for congestion.   Respiratory: Negative for cough, chest tightness and shortness of breath.   Cardiovascular: Negative for chest pain.  Gastrointestinal: Positive for nausea and diarrhea. Negative for vomiting and abdominal pain.  Genitourinary: Negative for dysuria, hematuria, vaginal bleeding and vaginal discharge.  Musculoskeletal: Negative for myalgias and arthralgias.  Skin: Negative for rash.  Neurological: Positive for weakness. Negative for dizziness, light-headedness and headaches.  A complete 10 system review of systems was obtained and all systems are negative except as noted in  the HPI and PMH.      Allergies  Amoxicillin; Hydrocodone; Oxycodone; Sulfa antibiotics; and Erythromycin  Home Medications   Prior to Admission medications   Medication Sig Start Date End Date Taking? Authorizing Provider  donepezil (ARICEPT) 5 MG tablet TAKE 1 TABLET BY MOUTH AT BEDTIME 01/18/15  Yes Ward Givens, NP  memantine (NAMENDA) 10 MG tablet Take 1 tablet (10 mg total) by mouth 2 (two) times daily. 03/11/15  Yes Kathrynn Ducking, MD  Multiple Vitamins-Minerals (PRESERVISION AREDS 2 PO) Take 1 capsule by mouth 2 (two) times daily.   Yes Historical Provider, MD  cephALEXin (KEFLEX) 500 MG capsule Take 1 capsule (500 mg total) by mouth 4 (four) times daily. 07/24/15   Ezequiel Essex, MD   BP 145/56 mmHg  Pulse 60  Temp(Src) 97.5  F (36.4 C) (Oral)  Resp 14  SpO2 98% Physical Exam  Constitutional: She appears well-developed and well-nourished. No distress.  HENT:  Head: Normocephalic and atraumatic.  Mouth/Throat: Oropharynx is clear and moist. No oropharyngeal exudate.  Oriented x2  Eyes: Conjunctivae and EOM are normal. Pupils are equal, round, and reactive to light.  Neck: Normal range of motion. Neck supple.  No meningismus.  Cardiovascular: Normal rate, regular rhythm, normal heart sounds and intact distal pulses.   No murmur heard. Pulmonary/Chest: Effort normal and breath sounds normal. No respiratory distress.  Abdominal: Soft. There is no tenderness. There is no rebound and no guarding.  Musculoskeletal: Normal range of motion. She exhibits no edema or tenderness.  Neurological: She is alert. No cranial nerve deficit. She exhibits normal muscle tone. Coordination normal.  No ataxia on finger to nose bilaterally. No pronator drift. 5/5 strength throughout. CN 2-12 intact. Equal grip strength. Sensation intact.   Skin: Skin is warm.  Psychiatric: She has a normal mood and affect. Her behavior is normal.  Nursing note and vitals reviewed.   ED Course  Procedures (including critical care time) Labs Review Labs Reviewed  URINALYSIS, ROUTINE W REFLEX MICROSCOPIC (NOT AT [facility]) - Abnormal; Notable for the following:    APPearance TURBID (*)    Hgb urine dipstick LARGE (*)    Ketones, ur 15 (*)    Protein, ur 30 (*)    Leukocytes, UA LARGE (*)    All other components within normal limits  COMPREHENSIVE METABOLIC PANEL - Abnormal; Notable for the following:    Total Protein 8.4 (*)    ALT 13 (*)    GFR calc non Af Amer 60 (*)    All other components within normal limits  URINE MICROSCOPIC-ADD ON - Abnormal; Notable for the following:    Squamous Epithelial / LPF FEW (*)    Bacteria, UA MANY (*)    All other components within normal limits  CBC WITH DIFFERENTIAL/PLATELET  LIPASE, BLOOD  TROPONIN I     Imaging Review Ct Abdomen Pelvis Wo Contrast  07/24/2015   CLINICAL DATA:  74 year old female with nausea and diarrhea for the past year.  EXAM: CT ABDOMEN AND PELVIS WITHOUT CONTRAST  TECHNIQUE: Multidetector CT imaging of the abdomen and pelvis was performed following the standard protocol without IV contrast.  COMPARISON:  CT of the abdomen and pelvis 01/16/2011.  FINDINGS: Lower chest:  Moderate hiatal hernia.  Mild cardiomegaly.  Hepatobiliary: 2.9 x 2.7 cm low-attenuation lesion in segment 4B of the liver (image 21 of series 2), larger than prior studies (previously a complex cystic lesion). No new hepatic lesions are otherwise noted. Numerous calcified gallstones lying dependently  in the gallbladder. No current findings to suggest an acute cholecystitis at this time.  Pancreas: No pancreatic mass or peripancreatic inflammatory changes on today's noncontrast CT examination.  Spleen: Unremarkable.  Adrenals/Urinary Tract: The unenhanced appearance of the kidneys and bilateral adrenal glands is normal. No hydroureteronephrosis. Urinary bladder is nearly completely decompressed, but there is a small locule of gas in the lumen of the urinary bladder.  Stomach/Bowel: Normal unenhanced appearance of the stomach. No pathologic dilatation of small bowel or colon. Numerous colonic diverticulae are noted, particularly in the sigmoid colon. While there are no overt surrounding inflammatory changes to suggest an acute diverticulitis at this time, 1 of the larger diverticulitis extending from the inferior aspect of the distal sigmoid colon (image 74 of series 2 and image 64 of series 5) comes in very close associations to the posterior aspect of the urinary bladder. Normal appendix.  Vascular/Lymphatic: Atherosclerotic calcifications throughout the abdominal and pelvic vasculature, without definite aneurysm. No lymphadenopathy noted in the abdomen or pelvis on today's noncontrast CT examination.  Reproductive:  Status post hysterectomy.  Ovaries are atrophic.  Other: No significant volume of ascites.  No pneumoperitoneum.  Musculoskeletal: There are no aggressive appearing lytic or blastic lesions noted in the visualized portions of the skeleton.  IMPRESSION: 1. Small locule of gas non dependently in the lumen of the urinary bladder. There is extensive diverticulosis, particularly of the sigmoid colon. One of the larger diverticula appears intimately associated with the posterior wall of the urinary bladder. The possibility of colovesical fistula should be considered, and correlation with urinalysis is recommended. 2. Enlarging 2.9 x 2.7 cm low-attenuation lesion in segment 4B of the liver, likely to represent a slowly enlarging minimally complex cyst (better demonstrated on prior MRI 01/13/2011). 3. Normal appendix. 4. Atherosclerosis. 5. Additional incidental findings, as above.   Electronically Signed   By: Vinnie Langton M.D.   On: 07/24/2015 13:51   I have personally reviewed and evaluated these images and lab results as part of my medical decision-making.   EKG Interpretation None      MDM   Final diagnoses:  Urinary tract infection without hematuria, site unspecified  Colovesical fistula   patient from assisted living facility with nausea and intermittent diarrhea for the past year. 5 caveat for dementia.   Labs appear to be at baseline. Urinalysis is positive for infection.  Patient's caregiver has arrived and states patient has significant memory issues. Not seen any evidence of diarrhea. Patient has difficulty remembering how to wipe in the bathroom and use the toilet. She is in independent living at this time. A caregiver checks on her 3 times a day.    CT results show probable colovesicular fistula. Patient does have urinary tract infection. She is hemodynamically stable. She is afebrile. There is no leukocytosis.   Discussed with Dr. Harlow Asa of general surgery. He feels this can be  addressed as an outpatient with Dr. Marcello Moores of colorectal surgery and urology.   discussed with patient and family. They're comfortable taking her home. They will follow up with both surgery and urology. Treat UTI. Return with worsening symptoms. Discussed this will be a recurrent problem and patient has potential to get sicker so she will need close follow-up as an outpatient.  Ezequiel Essex, MD 07/24/15 [phone]

## 2015-07-24 NOTE — Discharge Instructions (Signed)
Urinary Tract Infection  follow-up with the surgeon and urologist regarding the connection between your colon and bladder. Take the antibiotics as prescribed. Return to the ED if you develop fever, vomiting oriented concerns. Urinary tract infections (UTIs) can develop anywhere along your urinary tract. Your urinary tract is your body's drainage system for removing wastes and extra water. Your urinary tract includes two kidneys, two ureters, a bladder, and a urethra. Your kidneys are a pair of bean-shaped organs. Each kidney is about the size of your fist. They are located below your ribs, one on each side of your spine. CAUSES Infections are caused by microbes, which are microscopic organisms, including fungi, viruses, and bacteria. These organisms are so small that they can only be seen through a microscope. Bacteria are the microbes that most commonly cause UTIs. SYMPTOMS  Symptoms of UTIs may vary by age and gender of the patient and by the location of the infection. Symptoms in young women typically include a frequent and intense urge to urinate and a painful, burning feeling in the bladder or urethra during urination. Older women and men are more likely to be tired, shaky, and weak and have muscle aches and abdominal pain. A fever may mean the infection is in your kidneys. Other symptoms of a kidney infection include pain in your back or sides below the ribs, nausea, and vomiting. DIAGNOSIS To diagnose a UTI, your caregiver will ask you about your symptoms. Your caregiver also will ask to provide a urine sample. The urine sample will be tested for bacteria and white blood cells. White blood cells are made by your body to help fight infection. TREATMENT  Typically, UTIs can be treated with medication. Because most UTIs are caused by a bacterial infection, they usually can be treated with the use of antibiotics. The choice of antibiotic and length of treatment depend on your symptoms and the type of  bacteria causing your infection. HOME CARE INSTRUCTIONS  If you were prescribed antibiotics, take them exactly as your caregiver instructs you. Finish the medication even if you feel better after you have only taken some of the medication.  Drink enough water and fluids to keep your urine clear or pale yellow.  Avoid caffeine, tea, and carbonated beverages. They tend to irritate your bladder.  Empty your bladder often. Avoid holding urine for long periods of time.  Empty your bladder before and after sexual intercourse.  After a bowel movement, women should cleanse from front to back. Use each tissue only once. SEEK MEDICAL CARE IF:   You have back pain.  You develop a fever.  Your symptoms do not begin to resolve within 3 days. SEEK IMMEDIATE MEDICAL CARE IF:   You have severe back pain or lower abdominal pain.  You develop chills.  You have nausea or vomiting.  You have continued burning or discomfort with urination. MAKE SURE YOU:   Understand these instructions.  Will watch your condition.  Will get help right away if you are not doing well or get worse. Document Released: 08/16/2005 Document Revised: 05/07/2012 Document Reviewed: 12/15/2011 [facility] Patient Information 2015 [place], [place]. This information is not intended to replace advice given to you by your health care provider. Make sure you discuss any questions you have with your health care provider.

## 2015-07-24 NOTE — ED Notes (Signed)
Per EDP Rancour, going to modify abx to IM and do CT with only oral contrast due to inability to obtain IV access.

## 2015-08-09 ENCOUNTER — Telehealth: Payer: Self-pay | Admitting: Adult Health

## 2015-08-09 NOTE — Telephone Encounter (Signed)
Deborah/Caregiver [phone] called to advise patient was in the ED recently for a bad UTI, tests were ran and it was discovered that patient has a tear in her colon that might require surgery. Patient has several appointments this week one of which is a surgical consult. Neoma Laming had supper with sister and daughter last night, they are wanting to run this by Dr. Jannifer Franklin and [name] to see if it's a good idea to have surgery with patient having Alzheimer's and they are thinking about moving her to assisted living in [place], possibly [facility] on [address], they want Neoma Laming to check in with patient from time to time. Neoma Laming is not sure that patient will be able to take her dog with her if she goes into the Alzheimer's unit and [name] wanted patient to always have her dog with her. Neoma Laming states sister/POA is wanting to discontinue her Namenda and Aricept medication because she doesn't feel that it's doing any good. Neoma Laming states that sister/POA only sees patient at most once a month and/or special occasions, whereas Neoma Laming sees patient every day. Neoma Laming says she hasn't wandered but she has tried to wear her slip and camisole down to the dining room so now Neoma Laming attempts to be there before she gets to the dining room to prevent this from happening. [name] had previously mentioned having a family meeting. Family is agreeable to this. Neoma Laming feels it will be a good idea to have a family meeting. Neoma Laming also mentioned that they took all of patient's jewelry Saturday while she was sleeping. The sister told Neoma Laming that they were taking the jewelry and they are planning on selling it. She feels this may be due to her possibly moving to another facility but they didn't tell the patient they were doing this, so when patient woke up and found out what had happened she was very upset.

## 2015-08-09 NOTE — Telephone Encounter (Signed)
I spoke to the caregiver Neoma Laming. I informed her that depending on how severe the tear is in the colon will determine her need for surgery. Not having surgery may pose more risk to the patient. As far as her living situation is at the family's discretion as to where they feel that she will receive the care that she needs. Neoma Laming voiced understanding however our called was disconnected. I tried calling her back but I was sent to voice mail.

## 2015-08-09 NOTE — Telephone Encounter (Signed)
Called and spoke to [employer] / Building control surveyor and she wanted you to know all details please see below. Neoma Laming also would like to know with patient's DX. And her age. Do you think it's ok for her to have surgery ?

## 2015-08-18 ENCOUNTER — Encounter: Payer: Self-pay | Admitting: Physician Assistant

## 2015-08-18 ENCOUNTER — Telehealth: Payer: Self-pay | Admitting: Adult Health

## 2015-08-18 NOTE — Telephone Encounter (Signed)
[facility] [phone] called requesting FL2 for patient. Patient is in need of memory care.

## 2015-08-19 NOTE — Telephone Encounter (Signed)
Called and left message for Claudette Head at [place] at [facility] relayed [place] beinging worked on , I also asked how would she like sent when completed ? Faxed or mailed.?

## 2015-08-19 NOTE — Telephone Encounter (Signed)
Faxed Form

## 2015-08-19 NOTE — Telephone Encounter (Signed)
Rebecca Cross with Rebecca Cross called returning Rebecca Cross's phone call. I relayed msg - she is requesting FL2 to be faxed to [phone].

## 2015-08-25 ENCOUNTER — Ambulatory Visit (INDEPENDENT_AMBULATORY_CARE_PROVIDER_SITE_OTHER): Payer: Medicare Other | Admitting: Physician Assistant

## 2015-08-25 ENCOUNTER — Encounter: Payer: Self-pay | Admitting: Physician Assistant

## 2015-08-25 VITALS — BP 110/60 | HR 60 | Ht 64.57 in | Wt 202.2 lb

## 2015-08-25 DIAGNOSIS — Z1211 Encounter for screening for malignant neoplasm of colon: Secondary | ICD-10-CM | POA: Diagnosis not present

## 2015-08-25 DIAGNOSIS — N321 Vesicointestinal fistula: Secondary | ICD-10-CM | POA: Diagnosis not present

## 2015-08-25 NOTE — Patient Instructions (Signed)
You have been scheduled for a colonoscopy. Please follow written instructions given to you at your visit today.  Please pick up your prep supplies at the pharmacy within the next 1-3 days. If you use inhalers (even only as needed), please bring them with you on the day of your procedure.   

## 2015-08-25 NOTE — Progress Notes (Signed)
Patient ID: Rebecca Cross, female   DOB: 05-18-1941, 74 y.o.   MRN: [id number]    HPI:  Rebecca Cross is a 74 y.o.   female  referred by Michael Boston, MD for evaluation for possible colonoscopy for colovesical fistula. Rebecca Cross has a history of dementia, diverticulitis, asthma, anxiety, depression, COPD, seizures, vitamin D deficiency, and migraines. She has been evaluated by Dr. Vikki Ports of [facility] for frequent UTIs. She has had a CT scan with findings suspicious for colovesicular fistula. Rebecca Cross is accompanied with her caregiver. She reports that Rebecca Cross has had trouble with issues of diverticulitis and recurrent urinary tract infections. She has been admitted for an abscess on her bladder with gas suspicious for a colovesical fistula. She was treated with Vicente Males biotics but declined surgery in 2012. She again saw a general surgery in 2014 for evaluation of the mass on her hip. At the time possible surgery for her diverticulitis and fistula was reviewed however she again declined surgery. She was recently again evaluated with abdominal pain and had a CT that had evidence of colovesical fistula. She reports that she passes bubbles with urination. She is somewhat of a poor historian and is not sure if she moves her bowels regularly. She denies fever, chills or night sweats. She admits to some urinary urgency and dribbling. She has a history of dementia for which she is followed by neurology and is on Namenda and Aricept. Patient has never had a colonoscopy and denies any knowledge of family history of colon cancer, colon polyps, or inflammatory bowel disease.  Past Medical History  Diagnosis Date  . Diverticulitis of sigmoid colon     known diverticulitis of sigmoid  . Vaginal fistula     between vagina and colon  . Asthma   . Anxiety   . Depression   . Allergy   . COPD (chronic obstructive pulmonary disease) ([place])   . Seizures ([place])   . Sleep walking   . REM sleep behavior disorder   .  Gallstones   . Vitamin D deficiency   . Toe fracture, left     left 4th toe  . Migraine   . Cancer ([place])     basal cell carcinoma  . Clavicle fracture     right    Past Surgical History  Procedure Laterality Date  . Ankle surgery      left- pin put in  . Wisdom tooth extraction    . Tubal ligation    . Nose surgery    . Cataract extraction    . Skin cancer excision      x3, face and back  . Breast biopsy  1960    on lump, left  . Abdominal hysterectomy  1982    vaginal   Family History  Problem Relation Age of Onset  . COPD Mother   . Skin cancer Sister     Basel cell  . Cancer Paternal Grandmother   . Cancer Paternal Grandfather   . Breast cancer Mother   . Prostate cancer Father   . Skin cancer Father    Social History  Substance Use Topics  . Smoking status: Former Smoker    Quit date: 06/29/1963  . Smokeless tobacco: Never Used  . Alcohol Use: No   Current Outpatient Prescriptions  Medication Sig Dispense Refill  . cephALEXin (KEFLEX) 500 MG capsule Take 1 capsule (500 mg total) by mouth 4 (four) times daily. 40 capsule 0  . donepezil (ARICEPT) 5 MG  tablet TAKE 1 TABLET BY MOUTH AT BEDTIME 30 tablet 5  . memantine (NAMENDA) 10 MG tablet Take 1 tablet (10 mg total) by mouth 2 (two) times daily. 60 tablet 5  . Multiple Vitamins-Minerals (PRESERVISION AREDS 2 PO) Take 1 capsule by mouth 2 (two) times daily.     No current facility-administered medications for this visit.   Allergies  Allergen Reactions  . Amoxicillin Nausea And Vomiting  . Hydrocodone Nausea Only  . Oxycodone Nausea Only  . Sulfa Antibiotics Itching  . Erythromycin Rash     Review of Systems: Gen: Denies any fever, chills, sweats, anorexia, fatigue, weakness, malaise, weight loss, and sleep disorder CV: Denies chest pain, angina, palpitations, syncope, orthopnea, PND, peripheral edema, and claudication. Resp: Denies dyspnea at rest, dyspnea with exercise, cough, sputum, wheezing,  coughing up blood, and pleurisy. GI: Denies vomiting blood, jaundice, and fecal incontinence.   Denies dysphagia or odynophagia. GU : Denies urinary burning, blood in urine, urinary frequency, urinary hesitancy, nocturnal urination. He has intermittent incontinence and urinary urgency MS: Denies joint pain, limitation of movement, and swelling, stiffness, low back pain, extremity pain. Denies muscle weakness, cramps, atrophy.  Derm: Denies rash, itching, dry skin, hives, moles, warts, or unhealing ulcers.  Psych: Denies depression, anxiety, suicidal ideation, hallucinations, paranoia, and confusion. He has memory loss Heme: Denies bruising, bleeding, and enlarged lymph nodes. Neuro:  Denies any headaches, dizziness, paresthesias. Endo:  Denies any problems with DM, thyroid, adrenal function  Studies: Contrast   Status: Final result       PACS Images     Show images for CT Abdomen Pelvis Wo Contrast     Study Result     CLINICAL DATA: 74 year old female with nausea and diarrhea for the past year.  EXAM: CT ABDOMEN AND PELVIS WITHOUT CONTRAST  TECHNIQUE: Multidetector CT imaging of the abdomen and pelvis was performed following the standard protocol without IV contrast.  COMPARISON: CT of the abdomen and pelvis 01/16/2011.  FINDINGS: Lower chest: Moderate hiatal hernia. Mild cardiomegaly.  Hepatobiliary: 2.9 x 2.7 cm low-attenuation lesion in segment 4B of the liver (image 21 of series 2), larger than prior studies (previously a complex cystic lesion). No new hepatic lesions are otherwise noted. Numerous calcified gallstones lying dependently in the gallbladder. No current findings to suggest an acute cholecystitis at this time.  Pancreas: No pancreatic mass or peripancreatic inflammatory changes on today's noncontrast CT examination.  Spleen: Unremarkable.  Adrenals/Urinary Tract: The unenhanced appearance of the kidneys and bilateral adrenal glands is  normal. No hydroureteronephrosis. Urinary bladder is nearly completely decompressed, but there is a small locule of gas in the lumen of the urinary bladder.  Stomach/Bowel: Normal unenhanced appearance of the stomach. No pathologic dilatation of small bowel or colon. Numerous colonic diverticulae are noted, particularly in the sigmoid colon. While there are no overt surrounding inflammatory changes to suggest an acute diverticulitis at this time, 1 of the larger diverticulitis extending from the inferior aspect of the distal sigmoid colon (image 74 of series 2 and image 64 of series 5) comes in very close associations to the posterior aspect of the urinary bladder. Normal appendix.  Vascular/Lymphatic: Atherosclerotic calcifications throughout the abdominal and pelvic vasculature, without definite aneurysm. No lymphadenopathy noted in the abdomen or pelvis on today's noncontrast CT examination.  Reproductive: Status post hysterectomy. Ovaries are atrophic.  Other: No significant volume of ascites. No pneumoperitoneum.  Musculoskeletal: There are no aggressive appearing lytic or blastic lesions noted in the visualized portions of  the skeleton.  IMPRESSION: 1. Small locule of gas non dependently in the lumen of the urinary bladder. There is extensive diverticulosis, particularly of the sigmoid colon. One of the larger diverticula appears intimately associated with the posterior wall of the urinary bladder. The possibility of colovesical fistula should be considered, and correlation with urinalysis is recommended. 2. Enlarging 2.9 x 2.7 cm low-attenuation lesion in segment 4B of the liver, likely to represent a slowly enlarging minimally complex cyst (better demonstrated on prior MRI 01/13/2011). 3. Normal appendix. 4. Atherosclerosis. 5. Additional incidental findings, as above.   Electronically Signed  By: Vinnie Langton M.D.      LAB RESULTS: CBC on  07/24/2015 WC9.9, hemoglobin 14, hematocrit 43.5, platelets 177,000  Physical Exam: BP 110/60 mmHg  Pulse 60  Ht 5' 4.57" (1.64 m)  Wt 202 lb 4 oz (91.74 kg)  BMI 34.11 kg/m2 Constitutional: Pleasant,well-developed, female in no acute distress. HEENT: Normocephalic and atraumatic. Conjunctivae are normal. No scleral icterus. Neck supple. No thyromegaly Cardiovascular: Normal rate, regular rhythm.  Pulmonary/chest: Effort normal and breath sounds normal. No wheezing, rales or rhonchi. Abdominal: Soft, nondistended, mild tender just to palpation in suprapubic area but no rebound or guarding. Bowel sounds active throughout. There are no masses palpable. No hepatomegaly. Extremities: no edema Lymphadenopathy: No cervical adenopathy noted. Neurological: Alert and oriented to person place and time. Skin: Skin is warm and dry. No rashes noted. Psychiatric: Normal mood and affect. Behavior is normal.  ASSESSMENT AND PLAN: 74 year old female with a history of diverticular disease and recurrent UTIs, status post a CT suspicious for colovesical fistula. Patient has been referred by surgery for a colonoscopy to evaluate for polyps or neoplasia prior to undergoing possible segmental resection to remove the segment of chronic diverticulitis and repair fistula if the fistula is found.The risks, benefits, and alternatives to colonoscopy with possible biopsy and possible polypectomy were discussed with the patient and they consent to proceed. The procedure will be scheduled with Dr. Carlean Purl and attempts will be made to coordinate this with Dr. Vikki Ports of [facility] who would like to possibly do a cystoscopy at the time of colonoscopy.    Christiona Siddique, Vita Barley PA-C 08/25/2015, 8:31 PM  CC: Michael Boston, MD

## 2015-08-26 ENCOUNTER — Telehealth: Payer: Self-pay | Admitting: Adult Health

## 2015-08-26 ENCOUNTER — Ambulatory Visit (INDEPENDENT_AMBULATORY_CARE_PROVIDER_SITE_OTHER): Payer: Medicare Other | Admitting: Adult Health

## 2015-08-26 ENCOUNTER — Encounter: Payer: Self-pay | Admitting: Adult Health

## 2015-08-26 VITALS — BP 103/58 | HR 63 | Ht 64.0 in | Wt 200.0 lb

## 2015-08-26 DIAGNOSIS — F0390 Unspecified dementia without behavioral disturbance: Secondary | ICD-10-CM | POA: Diagnosis not present

## 2015-08-26 DIAGNOSIS — N390 Urinary tract infection, site not specified: Secondary | ICD-10-CM

## 2015-08-26 NOTE — Progress Notes (Signed)
I have read the note, and I agree with the clinical assessment and plan.  WILLIS,CHARLES KEITH   

## 2015-08-26 NOTE — Progress Notes (Signed)
PATIENT: Rebecca Cross DOB: 1940-11-24  REASON FOR VISIT: follow up HISTORY FROM: patient  HISTORY OF PRESENT ILLNESS: Ms. Rebecca Cross is a 74 year old female with a history of progressive memory disorder. She returns today for follow-up. The patient's power of attorney which is her sister is with her today. The patient's daughter Rebecca Cross is on speaker phone. They are considering placing the patient in a memory unit or assisted living. They feel that her symptoms have progressed. She is currently taking Namenda and Aricept. Her daughter reports that they have a caregiver that comes in and gives her her medication however she is not consistent with what time she comes to give her her medication. They are also concerned that the caregiver is spending little time with her however reporting to them that she is there longer. The patient is no longer participating in activities there. She says a lot of time in her room typically sleeping. They do feel that the patient's memory is getting worse however she is able to do most things for herself although she does require some prompting. The patient also has chronic urinary tract infections. She has a fissure between the colon and the bladder. Family has recommended that the patient follow with surgery however the patient has been resistant to this. The family has noticed that sometimes the patient's conversations are very garbled. The patient returns today primarily to discuss long-term care requirements.   HISTORY 05/20/15 (WILLIS): Miss Rebecca Cross is a 74 year old female with a history of progressive memory disorder. She returns today for follow-up. She is chronically taking Namenda and Aricept. Her caregiver reports that she feels her memory has remained stable. The patient continues to live in an independent living facility. Caregiver states that she no longer feels that depression is an issue for the patient. She states the patient has not been talking about being  left out or any suicidal thoughts. She states the patient is now sitting in the dining [place] with her friends to eat. Patient states that she is able to do most things for herself. She continues to walk her dog. Denies any issues of getting lost. Patient states that she wishes she could get out and do more things. According to the patient she has requested this from her family but they have other obligations right now. Caregiver states that she has not had any hallucinations. Denies any agitation or aggressiveness. Caregiver feels that the patient is craving more attention. She states that she wishes her family could come around more. Patient reports that she is having some issues with urinary incontinence. She plans to follow-up with her primary care provider. She returns today for an evaluation.   REVIEW OF SYSTEMS: Out of a complete 14 system review of symptoms, the patient complains only of the following symptoms, and all other reviewed systems are negative.  See history of present illness  ALLERGIES: Allergies  Allergen Reactions  . Amoxicillin Nausea And Vomiting  . Hydrocodone Nausea Only  . Oxycodone Nausea Only  . Sulfa Antibiotics Itching  . Erythromycin Rash    HOME MEDICATIONS: Outpatient Prescriptions Prior to Visit  Medication Sig Dispense Refill  . donepezil (ARICEPT) 5 MG tablet TAKE 1 TABLET BY MOUTH AT BEDTIME 30 tablet 5  . memantine (NAMENDA) 10 MG tablet Take 1 tablet (10 mg total) by mouth 2 (two) times daily. 60 tablet 5  . Multiple Vitamins-Minerals (PRESERVISION AREDS 2 PO) Take 1 capsule by mouth 2 (two) times daily.    . cephALEXin (  KEFLEX) 500 MG capsule Take 1 capsule (500 mg total) by mouth 4 (four) times daily. 40 capsule 0   No facility-administered medications prior to visit.    PAST MEDICAL HISTORY: Past Medical History  Diagnosis Date  . Diverticulitis of sigmoid colon     known diverticulitis of sigmoid  . Vaginal fistula     between vagina and  colon  . Asthma   . Anxiety   . Depression   . Allergy   . COPD (chronic obstructive pulmonary disease) ([place])   . Seizures ([place])   . Sleep walking   . REM sleep behavior disorder   . Gallstones   . Vitamin D deficiency   . Toe fracture, left     left 4th toe  . Migraine   . Cancer ([place])     basal cell carcinoma  . Clavicle fracture     right    PAST SURGICAL HISTORY: Past Surgical History  Procedure Laterality Date  . Ankle surgery      left- pin put in  . Wisdom tooth extraction    . Tubal ligation    . Nose surgery    . Cataract extraction    . Skin cancer excision      x3, face and back  . Breast biopsy  1960    on lump, left  . Abdominal hysterectomy  1982    vaginal    FAMILY HISTORY: Family History  Problem Relation Age of Onset  . COPD Mother   . Skin cancer Sister     Basel cell  . Cancer Paternal Grandmother   . Cancer Paternal Grandfather   . Breast cancer Mother   . Prostate cancer Father   . Skin cancer Father     SOCIAL HISTORY: Social History   Social History  . Marital Status: Divorced    Spouse Name: N/A  . Number of Children: 1  . Years of Education: COLLEGE   Occupational History  . Retired    Social History Main Topics  . Smoking status: Former Smoker    Quit date: 06/29/1963  . Smokeless tobacco: Never Used  . Alcohol Use: No  . Drug Use: No  . Sexual Activity: No   Other Topics Concern  . Not on file   Social History Narrative   Patient is right handed, resides alone      PHYSICAL EXAM  Filed Vitals:   08/26/15 1421  BP: 103/58  Pulse: 63  Height: 5\' 4"  (1.626 m)  Weight: 200 lb (90.719 kg)   Body mass index is 34.31 kg/(m^2).  MMSE - Mini Mental State Exam 08/26/2015 05/20/2015  Orientation to time 2 1  Orientation to Place 5 3  Registration 2 2  Attention/ Calculation 0 2  Recall 0 3  Language- name 2 objects 2 2  Language- repeat 1 1  Language- follow 3 step command 0 2  Language- read & follow  direction 0 1  Write a sentence 1 1  Copy design 0 0  Total score 13 18   Generalized: Well developed, in no acute distress   Neurological examination  Mentation: Alert . Follows commands however conversation often does not make sense. Cranial nerve II-XII: Pupils are equal round reactive to light. Extraocular movements were full, visual field were full on confrontational test. Facial sensation and strength were normal. Uvula tongue midline. Head turning and shoulder shrug  were normal and symmetric. Motor: The motor testing reveals 5 over 5 strength of all  4 extremities. Good symmetric motor tone is noted throughout.  Sensory: Sensory testing is intact to soft touch on all 4 extremities. No evidence of extinction is noted.  Coordination: Cerebellar testing reveals good finger-nose-finger and heel-to-shin bilaterally however there is difficulty understanding directions. Gait and station: Gait is normal.  Reflexes: Deep tendon reflexes are symmetric and normal bilaterally.   DIAGNOSTIC DATA (LABS, IMAGING, TESTING) - I reviewed patient records, labs, notes, testing and imaging myself where available.  Lab Results  Component Value Date   WBC 9.9 07/24/2015   HGB 14.0 07/24/2015   HCT 43.5 07/24/2015   MCV 91.8 07/24/2015   PLT 177 07/24/2015      Component Value Date/Time   NA 141 07/24/2015 0948   NA 142 02/11/2015 1413   K 3.9 07/24/2015 0948   CL 109 07/24/2015 0948   CO2 24 07/24/2015 0948   GLUCOSE 89 07/24/2015 0948   GLUCOSE 119* 02/11/2015 1413   BUN 20 07/24/2015 0948   BUN 14 02/11/2015 1413   CREATININE 0.93 07/24/2015 0948   CREATININE 0.80 07/31/2013 0747   CALCIUM 9.4 07/24/2015 0948   PROT 8.4* 07/24/2015 0948   PROT 7.1 02/11/2015 1413   ALBUMIN 4.6 07/24/2015 0948   AST 27 07/24/2015 0948   ALT 13* 07/24/2015 0948   ALKPHOS 97 07/24/2015 0948   BILITOT 0.6 07/24/2015 0948   BILITOT 0.3 02/11/2015 1413   GFRNONAA 60* 07/24/2015 0948   GFRAA >60  07/24/2015 0948    ASSESSMENT AND PLAN 74 y.o. year old female  has a past medical history of Diverticulitis of sigmoid colon; Vaginal fistula; Asthma; Anxiety; Depression; Allergy; COPD (chronic obstructive pulmonary disease) ([place]); Seizures ([place]); Sleep walking; REM sleep behavior disorder; Gallstones; Vitamin D deficiency; Toe fracture, left; Migraine; Cancer [facility]); and Clavicle fracture. here with:  1. Dementia 2. Chronic UTIs  The patient's memory score has declined since last visit. Her MMSE today is 13/30 was previously 18/30. I had a long discussion with the family regarding ongoing care for the patient. I do agree that she would benefit from an assisted living or memory care unit. I did explain to the family that changing her current environment could cause more confusion. They verbalized understanding. The patient also has chronic urinary tract infections and this also could be contributing to her memory issues. The patient will continue on Aricept and Namenda at this time. If her symptoms worsen or she develops new symptoms she will let us know. She will keep her follow-up appointment with Dr. Jannifer Franklin in January.  I spent 25 minutes with the patient. 50% of this time was spent counseling the patient and her family on her diagnosis and prognosis. As well as helping coordinate care.   Ward Givens, MSN, NP-C 08/26/2015, 2:38 PM [facility] [address], [place], [place] [address] [phone]

## 2015-08-26 NOTE — Patient Instructions (Signed)
Continue Namenda and Aricept If your symptoms worsen or you develop new symptoms please let us know.

## 2015-08-26 NOTE — Telephone Encounter (Signed)
Noted. I will see the patient today.

## 2015-08-26 NOTE — Telephone Encounter (Signed)
Spoke to daughter Arville Go. Daughter is concerned that mom's condition is worsening. POA will leave town tomorrow and daughter would like to speak w/ NP or MD about progression of disease in order to make future living arrangements for patient. Scheduled an appt w/ NP-Megan today. Daughter was grateful.

## 2015-08-26 NOTE — Telephone Encounter (Signed)
Pt's daughter called regarding pt's progress and prognosis. The family is strongly considering moving her to [place] where daughter lives. They are going to have a move quickly as the independent living does not have step down and the concern is she will be wandering. Please call and advise. She can be reached at [phone]

## 2015-08-29 NOTE — Progress Notes (Signed)
Does not seem possible to coordinate cysto and colonoscopy, unfortunately. Gatha Mayer, MD, [facility]

## 2015-08-30 ENCOUNTER — Telehealth: Payer: Self-pay | Admitting: *Deleted

## 2015-08-30 NOTE — Telephone Encounter (Signed)
-----   Message from Vita Barley Hvozdovic, PA-C sent at 08/25/2015  8:43 PM EDT ----- Rebecca Cross, this patient was in Wednesday afternoon. She was referred by Dr. gross of [facility] for a colonoscopy prior to having surgery for a colovesical fistula. Unfortunately, I received from [facility] after the patient had left. Apparently, Dr. Vikki Ports of [facility] with like to have the colonoscopy coordinated with his office so that Dr. Vikki Ports could possibly do a cystoscopy at the time of the colonoscopy. This means the procedure would need to be done at the hospital. The patient has been scheduled for at least the next week. Can you to ladies look in to the feasibility of this being coordinated with [facility] and [facility] and if so reschedule her for the hospital?

## 2015-08-30 NOTE — Telephone Encounter (Signed)
Per Dr. Carlean Purl, Does not seem possible to coordinate the cysto and colonoscopy.

## 2015-08-30 NOTE — Telephone Encounter (Signed)
Patient' daughter Di Kindle is calling as she needs a FL2 form filled out to admit her to assisted living. Please call.

## 2015-08-31 NOTE — Telephone Encounter (Signed)
I have spoken with Rebecca Cross.  Her address is [address] [place], [place] [address].  The facility pt. will be moving to is [facility] in [place], [place]  [address].  Caregiver Ethelda Chick will pick form up when it is complete/fim

## 2015-08-31 NOTE — Telephone Encounter (Signed)
LMTC./fim 

## 2015-08-31 NOTE — Telephone Encounter (Signed)
Daughter Rebecca Cross [phone] is calling back to check status of message she left yesterday. She hasn't heard back from anyone.

## 2015-08-31 NOTE — Telephone Encounter (Signed)
I have spoken with Rebecca Cross again, and per her request.  have mailed FL2 form to her home address in [place]/fim

## 2015-08-31 NOTE — Telephone Encounter (Signed)
FL2 form started, on Megan's desk for completion.  Need Joanna's address for form, and the name and address of the facility that needs this FL2 form./fim

## 2015-09-01 ENCOUNTER — Encounter: Payer: Medicare Other | Admitting: Internal Medicine

## 2015-09-11 ENCOUNTER — Other Ambulatory Visit: Payer: Self-pay | Admitting: Neurology

## 2015-09-17 ENCOUNTER — Telehealth: Payer: Self-pay | Admitting: Family Medicine

## 2015-09-17 NOTE — Telephone Encounter (Signed)
Spoke with patients care giver and patient is moving to [place] and will be seeing a new provider.  They will call with the new provider information.

## 2015-09-20 ENCOUNTER — Ambulatory Visit: Payer: Medicare Other | Admitting: Adult Health

## 2015-11-25 ENCOUNTER — Ambulatory Visit: Payer: Medicare Other | Admitting: Neurology

## 2016-02-29 ENCOUNTER — Ambulatory Visit: Payer: Medicare Other | Admitting: Neurology

## 2017-09-20 DEATH — deceased
